# Patient Record
Sex: Female | Born: 1985 | Race: White | Hispanic: No | Marital: Single | Smoking: Former smoker
Health system: Southern US, Community
[De-identification: ages and names within clinical notes are randomized; demographics above are authoritative.]

## PROBLEM LIST (undated history)

## (undated) DIAGNOSIS — R51 Headache: Secondary | ICD-10-CM

## (undated) DIAGNOSIS — IMO0002 Reserved for concepts with insufficient information to code with codable children: Secondary | ICD-10-CM

## (undated) DIAGNOSIS — E162 Hypoglycemia, unspecified: Secondary | ICD-10-CM

## (undated) DIAGNOSIS — S139XXA Sprain of joints and ligaments of unspecified parts of neck, initial encounter: Secondary | ICD-10-CM

## (undated) DIAGNOSIS — C539 Malignant neoplasm of cervix uteri, unspecified: Secondary | ICD-10-CM

## (undated) DIAGNOSIS — J45909 Unspecified asthma, uncomplicated: Secondary | ICD-10-CM

## (undated) DIAGNOSIS — T4145XA Adverse effect of unspecified anesthetic, initial encounter: Secondary | ICD-10-CM

## (undated) DIAGNOSIS — G43909 Migraine, unspecified, not intractable, without status migrainosus: Secondary | ICD-10-CM

## (undated) DIAGNOSIS — K589 Irritable bowel syndrome without diarrhea: Secondary | ICD-10-CM

## (undated) DIAGNOSIS — F419 Anxiety disorder, unspecified: Secondary | ICD-10-CM

## (undated) DIAGNOSIS — E669 Obesity, unspecified: Secondary | ICD-10-CM

## (undated) DIAGNOSIS — T8859XA Other complications of anesthesia, initial encounter: Secondary | ICD-10-CM

## (undated) DIAGNOSIS — Z8489 Family history of other specified conditions: Secondary | ICD-10-CM

## (undated) DIAGNOSIS — I499 Cardiac arrhythmia, unspecified: Secondary | ICD-10-CM

## (undated) HISTORY — DX: Anxiety disorder, unspecified: F41.9

## (undated) HISTORY — DX: Malignant neoplasm of cervix uteri, unspecified: C53.9

## (undated) HISTORY — DX: Reserved for concepts with insufficient information to code with codable children: IMO0002

## (undated) HISTORY — DX: Migraine, unspecified, not intractable, without status migrainosus: G43.909

## (undated) HISTORY — PX: WISDOM TOOTH EXTRACTION: SHX21

## (undated) HISTORY — PX: UPPER GI ENDOSCOPY: SHX6162

## (undated) HISTORY — DX: Headache: R51

## (undated) HISTORY — DX: Hypoglycemia, unspecified: E16.2

## (undated) HISTORY — DX: Unspecified asthma, uncomplicated: J45.909

## (undated) HISTORY — PX: COLONOSCOPY: SHX174

## (undated) HISTORY — DX: Sprain of joints and ligaments of unspecified parts of neck, initial encounter: S13.9XXA

## (undated) HISTORY — DX: Obesity, unspecified: E66.9

---
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
## 2005-03-26 HISTORY — PX: KNEE SURGERY: SHX244

## 2008-03-26 HISTORY — PX: NASAL SINUS SURGERY: SHX719

## 2009-07-01 ENCOUNTER — Ambulatory Visit: Payer: Self-pay | Admitting: Family Medicine

## 2009-07-01 DIAGNOSIS — J0100 Acute maxillary sinusitis, unspecified: Secondary | ICD-10-CM | POA: Insufficient documentation

## 2009-07-01 DIAGNOSIS — R300 Dysuria: Secondary | ICD-10-CM | POA: Insufficient documentation

## 2009-07-01 DIAGNOSIS — M26609 Unspecified temporomandibular joint disorder, unspecified side: Secondary | ICD-10-CM | POA: Insufficient documentation

## 2009-07-01 DIAGNOSIS — J209 Acute bronchitis, unspecified: Secondary | ICD-10-CM | POA: Insufficient documentation

## 2009-07-01 LAB — CONVERTED CEMR LAB
Basophils Absolute: 0 10*3/uL (ref 0.0–0.1)
Eosinophils Absolute: 0 10*3/uL (ref 0.0–0.7)
Eosinophils Relative: 0 % (ref 0–5)
HCT: 41.2 % (ref 36.0–46.0)
Hemoglobin: 14.1 g/dL (ref 12.0–15.0)
MCHC: 34.3 g/dL (ref 30.0–36.0)
Monocytes Absolute: 0.3 10*3/uL (ref 0.1–1.0)
Neutro Abs: 7.5 10*3/uL (ref 1.7–7.7)
RDW: 12.1 % (ref 11.5–15.5)
Urobilinogen, UA: 1
WBC: 11.6 10*3/uL — ABNORMAL HIGH (ref 4.0–10.5)
pH: 7

## 2009-07-02 ENCOUNTER — Encounter: Payer: Self-pay | Admitting: Family Medicine

## 2009-08-15 ENCOUNTER — Ambulatory Visit: Payer: Self-pay | Admitting: Occupational Medicine

## 2009-08-15 LAB — CONVERTED CEMR LAB: Rapid Strep: NEGATIVE

## 2009-08-16 ENCOUNTER — Encounter: Payer: Self-pay | Admitting: Occupational Medicine

## 2010-04-25 NOTE — Assessment & Plan Note (Signed)
Summary: WHEEZING/POSSIBLE UTI/VAGINAL BLEEDING   Vital Signs:  Patient Profile:   25 Years Old Female CC:      painful urination, hematuria, lower back pain, cramping X last night, productive cough X 4 weeks LMP:     06/22/2009 Height:     63.5 inches Weight:      185 pounds O2 Sat:      98 % O2 treatment:    Room Air Temp:     97.4 degrees F oral Pulse rate:   81 / minute Resp:     16 per minute BP sitting:   140 / 87  (right arm) Cuff size:   regular  Pt. in pain?   yes    Location:   back    Intensity:   5    Type:       aching  Vitals Entered By: Lajean Saver RN (July 01, 2009 12:30 PM)  Menstrual History: LMP (date): 06/22/2009                   Updated Prior Medication List: KLONOPIN 0.5 MG TABS (CLONAZEPAM) once daily IBUPROFEN 800 MG TABS (IBUPROFEN) prn  Current Allergies: ! AUGMENTIN (AMOXICILLIN-POT CLAVULANATE)History of Present Illness Chief Complaint: painful urination, hematuria, lower back pain, cramping X last night, productive cough X 4 weeks History of Present Illness: Subjective:  Patient presents with several problems: 1)  About a month ago she developed a cold, and now has persistent cough with wheezing/SOB,  and facial pain.  No recent fevers, chills, and sweats.  No pleuritic pain.  She has a history of asthma and recently has been using her daughter's Ventolin inhaler with decrease in wheezing.   She states that she was evaluated for back ache and possible kidney infection about a month ago and her WBC was mildly elevated at that time.  She has had pneumonia in the past. 2)  She complains of bilateral earache.  She notes that she grinds her teeth at night, and has worn retainers in the past 3)  She complains of mild dysuria/freauency/nocturia, and has noted blood in her urine.  Her last menstrual period ended 5 days ago, then she developed some recurrent vaginal bleeding, now resolved.  No vaginal discharge. No pelvic pain.  REVIEW OF SYSTEMS Constitutional Symptoms      Denies fever, chills, night sweats, weight loss, weight gain, and fatigue.  Eyes       Denies change in vision, eye pain, eye discharge, glasses, contact lenses, and eye surgery. Ear/Nose/Throat/Mouth       Complains of ear pain, dizziness, and sinus problems.      Denies hearing loss/aids, change in hearing, ear discharge, frequent runny nose, frequent nose bleeds, sore throat, hoarseness, and tooth pain or bleeding.  Respiratory       Complains of productive cough, wheezing, and shortness of breath.      Denies dry cough, asthma, bronchitis, and emphysema/COPD.  Cardiovascular       Denies murmurs, chest pain, and tires easily with exhertion.    Gastrointestinal       Complains of stomach pain and nausea/vomiting.      Denies diarrhea, constipation, blood in bowel movements,  and indigestion. Genitourniary       Complains of painful urination and blood or discharge from vagina.      Denies kidney stones and loss of urinary control. Neurological       Complains of headaches.      Denies paralysis, seizures, and fainting/blackouts. Musculoskeletal       Complains of muscle pain.      Denies joint pain, joint stiffness, decreased range of motion, redness, swelling, muscle weakness, and gout.      Comments: back and neck Skin       Denies bruising, unusual mles/lumps or sores, and hair/skin or nail changes.  Psych       Denies mood changes, temper/anger issues, anxiety/stress, speech problems, depression, and sleep problems. Other Comments: urinary symptoms X last night, URI symptoms X 4 weeks   Past History:  Past Medical History: hypoglycemia cervical CA (treated) Deg. disc disease C 5 and 6  and L5 S1  Past Surgical History: ACL replacement Sinus surgery 08/2008 left Eye surgery  Family History: HTN- father Family History of Skin cancer- father Family History Seizures and MS- mother  Social History: Current Smoker 1PPD Alcohol use-yes 2/week Drug use-no Occupation: umemployed Single Smoking Status:  current Drug Use:  no   Objective:  No acute distress  Eyes:  Pupils are equal, round, and reactive to light and accomdation.  Extraocular movement is intact.  Conjunctivae are not inflamed.  Ears:  Canals normal.  Tympanic membranes normal.  There is distinct tenderness over both temporomandibular joints Nose:  Normal septum.  Normal turbinates, mildly congested.   Bilateral maxillary sinus tenderness present.  Pharynx:  Normal  Neck:  Supple.  Shotty tender anterior nodes Lungs:   Bibasilar wheezing, worse on left.   Breath sounds are equal. Abdomen:  Mild suprapubic tenderness without masses or hepatosplenomegaly.  Bowel sounds are present.  Mild bilateral flank tenderness. urinalysis (dipstick):  trace leuks, micro negative Tympanograms:   normal bilaterally   CBC:  WBC 11.6 Assessment New Problems: ACUTE MAXILLARY SINUSITIS (ICD-461.0) TMJ SYNDROME (ICD-524.60) DYSURIA (ICD-788.1) BRONCHITIS, ACUTE WITH MILD BRONCHOSPASM (ICD-466.0) FAMILY HISTORY SEIZURES (ICD-V17.2)   Plan New Medications/Changes: TERAZOL 7 0.4 % CREA (TERCONAZOLE) 1 applicatorful PV at bedtime for one week  #1 tube x 0, 07/01/2009, Donna Christen MD CYCLOBENZAPRINE HCL 10 MG TABS (CYCLOBENZAPRINE HCL) One tab by mouth at bedtime for TMJ pain.  #20 x 0, 07/01/2009, Donna Christen MD CEPHALEXIN 500 MG TABS (CEPHALEXIN) One by mouth three times daily (every 8 hours)  #21 x 0, 07/01/2009, Donna Christen MD  PREDNISONE 10 MG TABS (PREDNISONE) 2 PO BID for 3 days, then 1 BID for 2 days, then 1 daily for 2 days.  Take PC  #18 x 0, 07/01/2009, Donna Christen MD CEFDINIR 300 MG CAPS (CEFDINIR) 1 by mouth q12hr  #20 x 0, 07/01/2009, Donna Christen MD  New Orders: T-CBC w/Diff [[id number]] Urinalysis [81003-65000] T-Chest x-ray, 2 views [71020] T-Culture, Urine [[id number]] New Patient Level IV [99204] Planning Comments:   Urine culture pending. Begin Keflex, expectorant/decongestant, tapering course of prednisone;  may continue albuterol inhaler Begin Ibuprofen 200mg , 4 tabs every 8 hours with food.  Apply ice pack to TMJ several times daily.  Given [employer] about TMJ.  Wear teeth guards at night.  Rx for Flexeril at bedtime. Rx for Terazol vaginal cream if candida vaginitis develops                     Follow-up with PCP if not improving 7 to 10 days.      The patient and/or caregiver has been counseled thoroughly with regard to medications prescribed including dosage, schedule, interactions, rationale for use, and possible side effects and they verbalize understanding.  Diagnoses and expected course of recovery discussed and will return if not improved as expected or if the condition worsens. Patient and/or caregiver verbalized understanding.    Prescriptions: TERAZOL 7 0.4 % CREA (TERCONAZOLE) 1 applicatorful PV at bedtime for one week  #1 tube x 0   Entered and Authorized by:   Donna Christen MD   Signed by:   Donna Christen MD on 07/01/2009   Method used:   Print then Give to Patient   RxID:   [id number] CYCLOBENZAPRINE HCL 10 MG TABS (CYCLOBENZAPRINE HCL) One tab by mouth at bedtime for TMJ pain.  #20 x 0   Entered and Authorized by:   Donna Christen MD   Signed by:   Donna Christen MD on 07/01/2009   Method used:   Print then Give to Patient   RxID:   [id number] CEPHALEXIN 500 MG TABS (CEPHALEXIN) One by mouth three times daily (every 8 hours)  #21 x 0   Entered and Authorized by:   Donna Christen MD   Signed by:   Donna Christen MD on 07/01/2009   Method used:   Print then Give to Patient   RxID:   [id number] PREDNISONE 10 MG TABS (PREDNISONE) 2 PO BID for 3 days, then 1 BID for 2 days, then 1 daily for 2 days.  Take PC  #18 x 0   Entered and Authorized by:   Donna Christen MD   Signed by:   Donna Christen MD on 07/01/2009   Method used:   Print then Give to Patient   RxID:   [id number] CEFDINIR 300 MG CAPS (CEFDINIR) 1 by mouth q12hr  #20 x 0   Entered and Authorized by:   Donna Christen MD   Signed by:   Donna Christen MD on 07/01/2009   Method used:   Print then Give to Patient   RxID:   [id number]   Patient Instructions: 1)  May use Mucinex D (guaifenesin with decongestant) twice daily for congestion. 2)  Increase fluid intake  3)  May take Ibuprofen 200mg , 4 tabs every 8 hours with food for TMJ pain. 4)  May use Ventolin inhaler 2 to 4 times daily. 5)  May use Afrin nasal spray (or generic oxymetazoline) twice daily for about 5 days.  Also recommend using saline nasal spray several  times daily and/or saline nasal irrigation. 6)  Followup with family doctor if not improving 7 to 10 days.  Laboratory Results   Urine Tests  Date/Time Received: July 01, 2009 2:02 PM  Date/Time Reported:  July 01, 2009 2:02 PM   Routine Urinalysis   Color: orange Appearance: Cloudy Glucose: negative   (Normal Range: Negative) Bilirubin: small   (Normal Range: Negative) Ketone: trace (5)   (Normal Range: Negative) Spec. Gravity: 1.015   (Normal Range: 1.003-1.035) Blood: negative   (Normal Range: Negative) pH: 7.0   (Normal Range: 5.0-8.0) Protein: 30   (Normal Range: Negative) Urobilinogen: 1.0   (Normal Range: 0-1) Nitrite: negative   (Normal Range: Negative) Leukocyte Esterace: trace   (Normal Range: Negative)

## 2010-04-25 NOTE — Assessment & Plan Note (Signed)
Summary: Sinus pressure, sorethroatt, cough x 1 wk rm 2   Vital Signs:  Patient Profile:   25 Years Old Female CC:      Cold & URI symptoms Height:     63.5 inches Weight:      186 pounds O2 Sat:      100 % O2 treatment:    Room Air Temp:     97.5 degrees F oral Pulse rate:   93 / minute Pulse rhythm:   regular Resp:     16 per minute BP sitting:   129 / 87  (right arm) Cuff size:   regular  Vitals Entered By: Areta Haber CMA (Aug 15, 2009 8:55 AM)                  Current Allergies: ! AUGMENTIN (AMOXICILLIN-POT CLAVULANATE) ! * MEDICAL TAPE    History of Present Illness Chief Complaint: Cold & URI symptoms History of Present Illness: Presents with a 3 day history of sore throat,  swollen tonsils, cough, and sinus congestion. No fever or chills.   She complains of bilateral ear pain and right ear drainage.   She was in here 6 weeks ago with another URI.  Symptoms from that episode resolved with keflex and prednisone.      Current Problems: OTITIS EXTERNA, UNSPEC. (ICD-380.10) ACUTE TONSILLITIS (ICD-463) ACUTE MAXILLARY SINUSITIS (ICD-461.0) TMJ SYNDROME (ICD-524.60) DYSURIA (ICD-788.1) BRONCHITIS, ACUTE WITH MILD BRONCHOSPASM (ICD-466.0) FAMILY HISTORY SEIZURES (ICD-V17.2)   Current Meds KLONOPIN 0.5 MG TABS (CLONAZEPAM) once daily IBUPROFEN 800 MG TABS (IBUPROFEN) prn FLEXERIL 10 MG TABS (CYCLOBENZAPRINE HCL) 1 tab by mouth bid CIPRODEX 0.3-0.1 % SUSP (CIPROFLOXACIN-DEXAMETHASONE) 2 drops in affected ear three times a day for 7 days AZITHROMYCIN 250 MG  TABS (AZITHROMYCIN) 2 by  mouth today and then 1 daily for 4 days PREDNISONE 10 MG TABS (PREDNISONE) 2 PO BID for 2 days, then 1 BID for 2 days, then 1 daily for 2 days.  Take PC  REVIEW OF SYSTEMS Constitutional Symptoms      Denies fever, chills, night sweats, weight loss, weight gain, and fatigue.  Eyes       Denies change in vision, eye pain, eye discharge, glasses, contact lenses, and eye  surgery. Ear/Nose/Throat/Mouth       Complains of sinus problems and sore throat.      Denies hearing loss/aids, change in hearing, ear pain, ear discharge, dizziness, frequent runny nose, frequent nose bleeds, hoarseness, and tooth pain or bleeding.      Comments: x 1 wk Respiratory       Complains of productive cough.      Denies dry cough, wheezing, shortness of breath, asthma, bronchitis, and emphysema/COPD.  Cardiovascular       Denies murmurs, chest pain, and tires easily with exhertion.    Gastrointestinal       Denies stomach pain, nausea/vomiting, diarrhea, constipation, blood in bowel movements, and indigestion. Genitourniary       Denies painful urination, kidney stones, and loss of urinary control. Neurological       Denies paralysis, seizures, and fainting/blackouts. Musculoskeletal       Denies muscle pain, joint pain, joint stiffness, decreased range of motion, redness, swelling, muscle weakness, and gout.  Skin       Denies bruising, unusual mles/lumps or sores, and hair/skin or nail changes.  Psych       Denies mood changes, temper/anger issues, anxiety/stress, speech problems, depression, and sleep problems. Other Comments: Pt has not seen  PCP for this.   Past History:  Past Medical History: Last updated: 07/01/2009 hypoglycemia cervical CA (treated) Deg. disc disease C 5 and 6  and L5 S1  Past Surgical History: Last updated: 07/01/2009 ACL replacement Sinus surgery 08/2008 left Eye surgery  Family History: Last updated: 07/01/2009 HTN- father Family History of Skin cancer- father Family History Seizures and MS- mother  Social History: Last updated: 07/01/2009 Current Smoker 1PPD Alcohol use-yes 2/week Drug use-no Occupation: umemployed Single  Risk Factors: Smoking Status: current (07/01/2009) Physical Exam General appearance: well developed, well nourished, no acute distress Pupils: equal, round, reactive to light Ears: Normal TM's  bilaterally.  Inflamed right ear canal.  Yellow drainage noted in the canal.  Nasal: swollen red turbinates with congestion Oral/Pharynx: pharyngeal erythema with exudate, uvula midline without deviation.  Swollen tonsils Chest/Lungs: no rales, wheezes, or rhonchi bilateral, breath sounds equal without effort Assessment New Problems: OTITIS EXTERNA, UNSPEC. (ICD-380.10) ACUTE TONSILLITIS (ICD-463)   Plan New Medications/Changes: PREDNISONE 10 MG TABS (PREDNISONE) 2 PO BID for 2 days, then 1 BID for 2 days, then 1 daily for 2 days.  Take PC  #14 x 0, 08/15/2009, Kathrine Haddock MD AZITHROMYCIN 250 MG  TABS (AZITHROMYCIN) 2 by  mouth today and then 1 daily for 4 days  #6 x 0, 08/15/2009, Kathrine Haddock MD CIPRODEX 0.3-0.1 % SUSP (CIPROFLOXACIN-DEXAMETHASONE) 2 drops in affected ear three times a day for 7 days  #1 x 0, 08/15/2009, Kathrine Haddock MD  New Orders: Est. Patient Level II [phone] Planning Comments:   Rapid strep is negative Recommend ciprodex for otitis externa z-pack Follow up with PCP if no better in 5-7 days   The patient and/or caregiver has been counseled thoroughly with regard to medications prescribed including dosage, schedule, interactions, rationale for use, and possible side effects and they verbalize understanding.  Diagnoses and expected course of recovery discussed and will return if not improved as expected or if the condition worsens. Patient and/or caregiver verbalized understanding.  Prescriptions: PREDNISONE 10 MG TABS (PREDNISONE) 2 PO BID for 2 days, then 1 BID for 2 days, then 1 daily for 2 days.  Take PC  #14 x 0   Entered and Authorized by:   Kathrine Haddock MD   Signed by:   Kathrine Haddock MD on 08/15/2009   Method used:   Print then Give to Patient   RxID:   [id number] AZITHROMYCIN 250 MG  TABS (AZITHROMYCIN) 2 by  mouth today and then 1 daily for 4 days  #6 x 0   Entered and Authorized by:   Kathrine Haddock MD   Signed by:   Kathrine Haddock MD  on 08/15/2009   Method used:   Print then Give to Patient   RxID:   [id number] CIPRODEX 0.3-0.1 % SUSP (CIPROFLOXACIN-DEXAMETHASONE) 2 drops in affected ear three times a day for 7 days  #1 x 0   Entered and Authorized by:   Kathrine Haddock MD   Signed by:   Kathrine Haddock MD on 08/15/2009   Method used:   Print then Give to Patient   RxID:   [id number]   Laboratory Results  Date/Time Received: Aug 15, 2009 9:25 AM  Date/Time Reported: Aug 15, 2009 9:25 AM   Other Tests  Rapid Strep: negative  Kit Test Internal QC: Negative   (Normal Range: Negative)   Appended Document: Sinus pressure, sorethroatt, cough x 1 wk rm 2 Since rapid strep was negative,  I will go ahead and order throat culture.

## 2010-04-25 NOTE — Therapy (Signed)
Summary: Audiology  Audiology   Imported By: Joanne Chars CMA 07/02/2009 13:35:00  _____________________________________________________________________  External Attachment:    Type:   Image     Comment:   External Document

## 2011-07-10 ENCOUNTER — Ambulatory Visit: Payer: Medicaid Other | Attending: Physical Medicine and Rehabilitation | Admitting: Physical Therapy

## 2011-07-10 DIAGNOSIS — IMO0001 Reserved for inherently not codable concepts without codable children: Secondary | ICD-10-CM | POA: Insufficient documentation

## 2011-07-10 DIAGNOSIS — M6281 Muscle weakness (generalized): Secondary | ICD-10-CM | POA: Insufficient documentation

## 2011-07-10 DIAGNOSIS — M2550 Pain in unspecified joint: Secondary | ICD-10-CM | POA: Insufficient documentation

## 2011-07-17 ENCOUNTER — Ambulatory Visit: Payer: Medicaid Other | Admitting: Physical Therapy

## 2011-07-31 ENCOUNTER — Ambulatory Visit: Payer: Medicaid Other | Attending: Physical Medicine and Rehabilitation | Admitting: Physical Therapy

## 2011-07-31 DIAGNOSIS — M2550 Pain in unspecified joint: Secondary | ICD-10-CM | POA: Insufficient documentation

## 2011-07-31 DIAGNOSIS — IMO0001 Reserved for inherently not codable concepts without codable children: Secondary | ICD-10-CM | POA: Insufficient documentation

## 2011-07-31 DIAGNOSIS — M6281 Muscle weakness (generalized): Secondary | ICD-10-CM | POA: Insufficient documentation

## 2011-08-06 ENCOUNTER — Ambulatory Visit: Payer: Medicaid Other | Admitting: Physical Therapy

## 2011-09-18 ENCOUNTER — Other Ambulatory Visit (HOSPITAL_BASED_OUTPATIENT_CLINIC_OR_DEPARTMENT_OTHER): Payer: Self-pay | Admitting: Physical Medicine and Rehabilitation

## 2011-09-18 DIAGNOSIS — M545 Low back pain, unspecified: Secondary | ICD-10-CM

## 2011-09-22 ENCOUNTER — Ambulatory Visit (HOSPITAL_BASED_OUTPATIENT_CLINIC_OR_DEPARTMENT_OTHER)
Admission: RE | Admit: 2011-09-22 | Discharge: 2011-09-22 | Disposition: A | Discharge status: Home / Self Care | Payer: Medicaid Other | Source: Ambulatory Visit | Attending: Physical Medicine and Rehabilitation | Admitting: Physical Medicine and Rehabilitation

## 2011-09-22 DIAGNOSIS — M545 Low back pain, unspecified: Secondary | ICD-10-CM

## 2011-09-22 DIAGNOSIS — M5137 Other intervertebral disc degeneration, lumbosacral region: Secondary | ICD-10-CM | POA: Insufficient documentation

## 2011-09-22 DIAGNOSIS — M51379 Other intervertebral disc degeneration, lumbosacral region without mention of lumbar back pain or lower extremity pain: Secondary | ICD-10-CM | POA: Insufficient documentation

## 2011-09-22 DIAGNOSIS — M5126 Other intervertebral disc displacement, lumbar region: Secondary | ICD-10-CM | POA: Insufficient documentation

## 2012-02-04 ENCOUNTER — Other Ambulatory Visit (HOSPITAL_BASED_OUTPATIENT_CLINIC_OR_DEPARTMENT_OTHER): Payer: Self-pay | Admitting: Neurosurgery

## 2012-02-04 DIAGNOSIS — M4802 Spinal stenosis, cervical region: Secondary | ICD-10-CM

## 2012-02-05 ENCOUNTER — Ambulatory Visit (HOSPITAL_BASED_OUTPATIENT_CLINIC_OR_DEPARTMENT_OTHER)
Admission: RE | Admit: 2012-02-05 | Discharge: 2012-02-05 | Disposition: A | Discharge status: Home / Self Care | Payer: Medicaid Other | Source: Ambulatory Visit | Attending: Neurosurgery | Admitting: Neurosurgery

## 2012-02-05 ENCOUNTER — Other Ambulatory Visit (HOSPITAL_BASED_OUTPATIENT_CLINIC_OR_DEPARTMENT_OTHER): Payer: Medicaid Other

## 2012-02-05 DIAGNOSIS — M4802 Spinal stenosis, cervical region: Secondary | ICD-10-CM | POA: Insufficient documentation

## 2012-10-28 ENCOUNTER — Encounter: Payer: Self-pay | Admitting: Neurology

## 2012-10-28 ENCOUNTER — Ambulatory Visit (INDEPENDENT_AMBULATORY_CARE_PROVIDER_SITE_OTHER): Payer: Medicaid Other | Admitting: Neurology

## 2012-10-28 VITALS — BP 115/73 | HR 73 | Ht 63.75 in | Wt 210.0 lb

## 2012-10-28 DIAGNOSIS — S139XXA Sprain of joints and ligaments of unspecified parts of neck, initial encounter: Secondary | ICD-10-CM

## 2012-10-28 DIAGNOSIS — R51 Headache: Secondary | ICD-10-CM

## 2012-10-28 DIAGNOSIS — R519 Headache, unspecified: Secondary | ICD-10-CM | POA: Insufficient documentation

## 2012-10-28 HISTORY — DX: Sprain of joints and ligaments of unspecified parts of neck, initial encounter: S13.9XXA

## 2012-10-28 HISTORY — DX: Headache: R51

## 2012-10-28 MED ORDER — NORTRIPTYLINE HCL 10 MG PO CAPS: ORAL_CAPSULE | ORAL | Status: DC

## 2012-10-28 NOTE — Progress Notes (Signed)
Reason for visit: Headache  Shannon Hines is a 27 y.o. female  History of present illness:  Shannon Hines is a 27 year old right-handed white female with a history of headaches and neck discomfort dating back at least one year. The patient has had headaches on average 20 days out of a month, with only 10 headache free days on average a month. The patient indicates that the headaches are on the top of the head associated with a pressure sensation, occasionally with a shooting pain. The patient also reports some neck discomfort that is chronic in nature, associated with some spasm into the shoulders. The patient indicates that the headaches vary with the neck pain. The patient also has tingling sensations into the hands that also parallels the neck discomfort. The patient reports neck stiffness. The patient has undergone MRI evaluation of the cervical spine that shows minimal disc bulging at the C3-4, C4-5, and C5-6 levels without spinal cord compression. The patient denies any weakness of the extremities or problems controlling the bowels or the bladder. Occasionally, she may have true vertigo associated with the headaches. The patient will occasionally have nausea and vomiting, and she does have a history of true migraine in the past. The patient may have blurred vision with the headache. The patient is sent to this office for further evaluation of the headaches. The patient was placed on Topamax, but she has not taken this medication regularly secondary to the effects on her sense of taste. The patient has Flexeril that she takes on occasion. The patient has not undergone neuromuscular therapy.  Past Medical History  Diagnosis Date  . Migraine   . Anxiety   . Cervical cancer   . Asthma   . Degenerative disc disease   . Hypoglycemia   . Sprain of neck 10/28/2012  . Headache(784.0) 10/28/2012  . Obesity     Past Surgical History  Procedure Laterality Date  . Eye surgery Bilateral 1987   Strabismus surgery  . Nasal sinus surgery  2010  . Knee surgery  2007    ACL    Family History  Problem Relation Age of Onset  . Multiple sclerosis Mother   . Parkinsonism Father   . Hypertension Father   . Lung cancer Maternal Grandmother   . Cancer Paternal Grandfather   . Marfan syndrome Maternal Grandfather     Social history:  reports that she quit smoking about 2 years ago. She does not have any smokeless tobacco history on file. She reports that  drinks alcohol. She reports that she does not use illicit drugs.  Medications:  No current outpatient prescriptions on file prior to visit.   No current facility-administered medications on file prior to visit.    Allergies:  Allergies  Allergen Reactions  . Amoxicillin-Pot Clavulanate     REACTION: rectal bleeding , vomiting  . Adhesive (Tape)   . Cefdinir     ROS:  Out of a complete 14 system review of symptoms, the patient complains only of the following symptoms, and all other reviewed systems are negative.  Fatigue Palpitations of the heart Dizziness Blurred vision Snoring Memory loss, confusion, headache, numbness, weakness, tremor Anxiety, insomnia  Blood pressure 115/73, pulse 73, height 5' 3.75" (1.619 m), weight 210 lb (95.255 kg).  Physical Exam  General: The patient is alert and cooperative at the time of the examination. The patient is moderately obese.  Head: Pupils are equal, round, and reactive to light. Discs are flat bilaterally.  Neck: The neck  is supple, no carotid bruits are noted.  Respiratory: The respiratory examination is clear.  Cardiovascular: The cardiovascular examination reveals a regular rate and rhythm, no obvious murmurs or rubs are noted.  Neuromuscular: The patient lacks about 10-15 of lateral rotation of the cervical spine bilaterally.  Skin: Extremities are without significant edema.  Neurologic Exam  Mental status:  Cranial nerves: Facial symmetry is present.  There is good sensation of the face to pinprick and soft touch bilaterally. The strength of the facial muscles and the muscles to head turning and shoulder shrug are normal bilaterally. Speech is well enunciated, no aphasia or dysarthria is noted. Extraocular movements are full. Visual fields are full.  Motor: The motor testing reveals 5 over 5 strength of all 4 extremities. Good symmetric motor tone is noted throughout.  Sensory: Sensory testing is intact to pinprick, soft touch, vibration sensation, and position sense on the extremities, but the patient notes that the left arm and left leg are slightly less sensitive to pinprick sensation than the right side. No evidence of extinction is noted.  Coordination: Cerebellar testing reveals good finger-nose-finger and heel-to-shin bilaterally.  Gait and station: Gait is normal. Tandem gait is normal. Romberg is negative. No drift is seen.  Reflexes: Deep tendon reflexes are symmetric and normal bilaterally. Toes are downgoing bilaterally.   Assessment/Plan:  1. Cervical strain syndrome  2. Muscle tension headache  3. History of migraine  The patient likely has headaches associated with her cervical strain syndrome. The patient may have some migraine as well. The numbness and tingly sensations into the hands is likely related to the muscle spasm as well. The patient will be placed on nortriptyline, with a gradually increasing dose. The patient will be set up for neuromuscular therapy. The patient is to learn a home regimen for neuromuscular therapy. The patient will undergo MRI evaluation of the brain. The patient will followup through this office in 4 months.  Marlan Palau MD 10/28/2012 7:30 PM  [facility] [address] [place], [place] [address]  Phone [phone] Fax [phone]

## 2012-11-26 ENCOUNTER — Inpatient Hospital Stay: Admission: RE | Admit: 2012-11-26 | Payer: Medicaid Other | Source: Ambulatory Visit

## 2012-12-03 ENCOUNTER — Telehealth: Payer: Self-pay

## 2012-12-03 NOTE — Telephone Encounter (Signed)
I spoke with Shannon Hines and we reviewed this case. Patient has been on medication without relief. Shannon Hines will resubmit request for MRI now.

## 2012-12-03 NOTE — Telephone Encounter (Signed)
I called patient to confirm she had an appointment scheduled for an MRI. She said the imaging place called and scheduled and then called back and cancelled and said there was an issue with her insurance. I let her know that,according to our record, she has been approved by her insurance. I will speak with [facility] and see what is going on and will get back to her. Patient thanked me.

## 2012-12-12 ENCOUNTER — Ambulatory Visit
Admission: RE | Admit: 2012-12-12 | Discharge: 2012-12-12 | Disposition: A | Discharge status: Home / Self Care | Payer: Medicaid Other | Source: Ambulatory Visit | Attending: Neurology | Admitting: Neurology

## 2012-12-12 DIAGNOSIS — S139XXA Sprain of joints and ligaments of unspecified parts of neck, initial encounter: Secondary | ICD-10-CM

## 2012-12-12 DIAGNOSIS — R51 Headache: Secondary | ICD-10-CM

## 2012-12-15 ENCOUNTER — Telehealth: Payer: Self-pay | Admitting: Neurology

## 2012-12-15 MED ORDER — NORTRIPTYLINE HCL 25 MG PO CAPS: 50.0000 mg | ORAL_CAPSULE | Freq: Every day | ORAL | Status: DC

## 2012-12-15 NOTE — Telephone Encounter (Signed)
I called the patient. The MRI the brain is normal. The patient is on nortriptyline at 30 mg at night. This has essentially eliminated all of her headaches. The patient is still having a lot of neck pain, but her insurance will not cover a lot of physical therapy for neuromuscular therapy. The patient will be going on her insurance at work, and getting off of Medicaid. She will contact them when this happens, and we will make another referral for neuromuscular therapy. We will go up on the nortriptyline to 50 mg at night. The patient is to do stretching exercises for the neck on a regular basis.

## 2013-01-14 ENCOUNTER — Telehealth: Payer: Self-pay | Admitting: Neurology

## 2013-01-14 MED ORDER — NORTRIPTYLINE HCL 25 MG PO CAPS: 50.0000 mg | ORAL_CAPSULE | Freq: Every day | ORAL | Status: DC

## 2013-01-14 NOTE — Telephone Encounter (Signed)
The updated Rx was already sent to [facility] on 09/22 per Dr Anne Hahn' note.  I have resent the Rx.

## 2013-02-24 ENCOUNTER — Telehealth: Payer: Self-pay

## 2013-02-24 NOTE — Telephone Encounter (Signed)
Patient has to cancel appointment due to insurance and needed a late afternoon appointment because of her work schedule.  She will need refills before the appointment and will call the office.

## 2013-02-27 ENCOUNTER — Ambulatory Visit: Payer: Medicaid Other | Admitting: Nurse Practitioner

## 2013-04-22 ENCOUNTER — Telehealth: Payer: Self-pay | Admitting: Nurse Practitioner

## 2013-04-23 NOTE — Telephone Encounter (Signed)
Patient last saw Dr. Jannifer Franklin on 10/28/2012 for headaches and neck pain,  Was started on Nortriptiline 30 mg.  Patient has a MRI on 12/12/2012 which was normal and the medication was increased to 50 mg.  Called patient to see if the medication is keeping her symptoms under control? And if so when does she need to followup with the doctor?

## 2013-04-23 NOTE — Telephone Encounter (Signed)
Patient states that her appointment has been pushed out for 3 months - twice and she does see her neurosugeon for her neck problems and the Nortriptyline is controlling her headaches, so does she need to followup with you.  Please advise.

## 2013-04-23 NOTE — Telephone Encounter (Signed)
I called patient. The patient is doing very well on nortriptyline. The patient was then note she can consolidate doctors, and have her primary care physician write the prescription for the nortriptyline. If her doctor is willing to do this, the patient may not need a revisit through this office. The patient could be seen if needed.

## 2013-05-07 ENCOUNTER — Ambulatory Visit: Payer: Self-pay | Admitting: Nurse Practitioner

## 2013-08-03 ENCOUNTER — Ambulatory Visit: Payer: Medicaid Other | Admitting: Nurse Practitioner

## 2013-11-25 ENCOUNTER — Other Ambulatory Visit (HOSPITAL_BASED_OUTPATIENT_CLINIC_OR_DEPARTMENT_OTHER): Payer: Self-pay | Admitting: Neurosurgery

## 2013-11-25 DIAGNOSIS — M5412 Radiculopathy, cervical region: Secondary | ICD-10-CM

## 2013-11-25 DIAGNOSIS — M5126 Other intervertebral disc displacement, lumbar region: Secondary | ICD-10-CM

## 2013-11-28 ENCOUNTER — Ambulatory Visit (HOSPITAL_BASED_OUTPATIENT_CLINIC_OR_DEPARTMENT_OTHER)
Admission: RE | Admit: 2013-11-28 | Discharge: 2013-11-28 | Disposition: A | Discharge status: Home / Self Care | Payer: Medicaid Other | Source: Ambulatory Visit | Attending: Neurosurgery | Admitting: Neurosurgery

## 2013-11-28 DIAGNOSIS — M47812 Spondylosis without myelopathy or radiculopathy, cervical region: Secondary | ICD-10-CM | POA: Insufficient documentation

## 2013-11-28 DIAGNOSIS — M5124 Other intervertebral disc displacement, thoracic region: Secondary | ICD-10-CM | POA: Diagnosis not present

## 2013-11-28 DIAGNOSIS — R209 Unspecified disturbances of skin sensation: Secondary | ICD-10-CM | POA: Diagnosis not present

## 2013-11-28 DIAGNOSIS — M5380 Other specified dorsopathies, site unspecified: Secondary | ICD-10-CM | POA: Insufficient documentation

## 2013-11-28 DIAGNOSIS — M5030 Other cervical disc degeneration, unspecified cervical region: Secondary | ICD-10-CM | POA: Insufficient documentation

## 2013-11-28 DIAGNOSIS — M25559 Pain in unspecified hip: Secondary | ICD-10-CM | POA: Diagnosis present

## 2013-11-28 DIAGNOSIS — M5126 Other intervertebral disc displacement, lumbar region: Secondary | ICD-10-CM

## 2013-11-28 DIAGNOSIS — G8929 Other chronic pain: Secondary | ICD-10-CM | POA: Diagnosis not present

## 2013-11-28 DIAGNOSIS — M542 Cervicalgia: Secondary | ICD-10-CM | POA: Diagnosis not present

## 2013-11-28 DIAGNOSIS — M48061 Spinal stenosis, lumbar region without neurogenic claudication: Secondary | ICD-10-CM | POA: Diagnosis not present

## 2013-11-28 DIAGNOSIS — M5412 Radiculopathy, cervical region: Secondary | ICD-10-CM

## 2013-12-22 ENCOUNTER — Encounter (HOSPITAL_COMMUNITY): Payer: Self-pay | Admitting: Pharmacy Technician

## 2013-12-22 ENCOUNTER — Other Ambulatory Visit: Payer: Self-pay | Admitting: Neurosurgery

## 2013-12-24 ENCOUNTER — Encounter (HOSPITAL_COMMUNITY): Payer: Self-pay

## 2013-12-24 ENCOUNTER — Encounter (HOSPITAL_COMMUNITY)
Admission: RE | Admit: 2013-12-24 | Discharge: 2013-12-24 | Disposition: A | Discharge status: Home / Self Care | Payer: Medicaid Other | Source: Ambulatory Visit | Attending: Neurosurgery | Admitting: Neurosurgery

## 2013-12-24 DIAGNOSIS — Z791 Long term (current) use of non-steroidal anti-inflammatories (NSAID): Secondary | ICD-10-CM | POA: Diagnosis not present

## 2013-12-24 DIAGNOSIS — E669 Obesity, unspecified: Secondary | ICD-10-CM | POA: Diagnosis not present

## 2013-12-24 DIAGNOSIS — Z79899 Other long term (current) drug therapy: Secondary | ICD-10-CM | POA: Diagnosis not present

## 2013-12-24 DIAGNOSIS — M4802 Spinal stenosis, cervical region: Secondary | ICD-10-CM | POA: Diagnosis not present

## 2013-12-24 DIAGNOSIS — Z87891 Personal history of nicotine dependence: Secondary | ICD-10-CM | POA: Diagnosis not present

## 2013-12-24 DIAGNOSIS — M4722 Other spondylosis with radiculopathy, cervical region: Secondary | ICD-10-CM | POA: Diagnosis present

## 2013-12-24 DIAGNOSIS — G43909 Migraine, unspecified, not intractable, without status migrainosus: Secondary | ICD-10-CM | POA: Diagnosis not present

## 2013-12-24 HISTORY — DX: Family history of other specified conditions: Z84.89

## 2013-12-24 HISTORY — DX: Irritable bowel syndrome, unspecified: K58.9

## 2013-12-24 HISTORY — DX: Cardiac arrhythmia, unspecified: I49.9

## 2013-12-24 HISTORY — DX: Adverse effect of unspecified anesthetic, initial encounter: T41.45XA

## 2013-12-24 HISTORY — DX: Other complications of anesthesia, initial encounter: T88.59XA

## 2013-12-24 LAB — CBC
HCT: 41.7 % (ref 36.0–46.0)
HEMOGLOBIN: 14 g/dL (ref 12.0–15.0)
MCH: 30.2 pg (ref 26.0–34.0)
MCHC: 33.6 g/dL (ref 30.0–36.0)
MCV: 89.9 fL (ref 78.0–100.0)
PLATELETS: 276 10*3/uL (ref 150–400)
RBC: 4.64 MIL/uL (ref 3.87–5.11)
RDW: 12.4 % (ref 11.5–15.5)
WBC: 8.5 10*3/uL (ref 4.0–10.5)

## 2013-12-24 LAB — BASIC METABOLIC PANEL
Anion gap: 12 (ref 5–15)
BUN: 13 mg/dL (ref 6–23)
CHLORIDE: 101 meq/L (ref 96–112)
CO2: 26 mEq/L (ref 19–32)
Calcium: 9.2 mg/dL (ref 8.4–10.5)
Creatinine, Ser: 0.74 mg/dL (ref 0.50–1.10)
GFR calc Af Amer: >90 mL/min (ref >90)
GLUCOSE: 66 mg/dL — AB (ref 70–99)
POTASSIUM: 3.9 meq/L (ref 3.7–5.3)
SODIUM: 139 meq/L (ref 137–147)

## 2013-12-24 LAB — SURGICAL PCR SCREEN
MRSA, PCR: NEGATIVE
STAPHYLOCOCCUS AUREUS: NEGATIVE

## 2013-12-24 LAB — HCG, SERUM, QUALITATIVE: PREG SERUM: NEGATIVE

## 2013-12-24 NOTE — Pre-Procedure Instructions (Signed)
Miryam Mcelhinney    Your procedure is scheduled on: Friday, October 2.  Report to [facility] Admitting at 11:00AM.  Call this number if you have problems the morning of surgery: [phone]   Remember:   Do not eat food or drink liquids after midnight.  Take these medicines the morning of surgery with A SIP OF WATER: Take if needed: clonazePAM (KLONOPIN).              Stop taking Meloxicam (Mobic)   Do not wear jewelry, make-up or nail polish.  Do not wear lotions, powders, or perfumes.   Do not shave 48 hours prior to surgery.   Do not bring valuables to the hospital.             [facility] is not responsible for any belongings or valuables.               Contacts, dentures or bridgework may not be worn into surgery.  Leave suitcase in the car. After surgery it may be brought to your room.  For patients admitted to the hospital, discharge time is determined by your treatment team.               Patients discharged the day of surgery will not be allowed to drive home.  Name and phone number of your driver: -   Special Instructions: Review  [facility] - Preparing For Surgery.   Please read over the following fact sheets that you were given: Pain Booklet, Coughing and Deep Breathing and Surgical Site Infection Prevention

## 2013-12-25 ENCOUNTER — Encounter (HOSPITAL_COMMUNITY)
Admission: RE | Disposition: A | Discharge status: Home / Self Care | Payer: Self-pay | Source: Ambulatory Visit | Attending: Neurosurgery

## 2013-12-25 ENCOUNTER — Encounter (HOSPITAL_COMMUNITY): Payer: Self-pay | Admitting: *Deleted

## 2013-12-25 ENCOUNTER — Ambulatory Visit (HOSPITAL_COMMUNITY): Payer: Medicaid Other | Admitting: Anesthesiology

## 2013-12-25 ENCOUNTER — Ambulatory Visit (HOSPITAL_COMMUNITY): Payer: Medicaid Other

## 2013-12-25 ENCOUNTER — Encounter (HOSPITAL_COMMUNITY): Payer: Medicaid Other | Admitting: Anesthesiology

## 2013-12-25 ENCOUNTER — Ambulatory Visit (HOSPITAL_COMMUNITY)
Admission: RE | Admit: 2013-12-25 | Discharge: 2013-12-25 | Disposition: A | Discharge status: Home / Self Care | Payer: Medicaid Other | Source: Ambulatory Visit | Attending: Neurosurgery | Admitting: Neurosurgery

## 2013-12-25 DIAGNOSIS — G43909 Migraine, unspecified, not intractable, without status migrainosus: Secondary | ICD-10-CM | POA: Diagnosis not present

## 2013-12-25 DIAGNOSIS — E669 Obesity, unspecified: Secondary | ICD-10-CM | POA: Diagnosis not present

## 2013-12-25 DIAGNOSIS — M4802 Spinal stenosis, cervical region: Secondary | ICD-10-CM | POA: Insufficient documentation

## 2013-12-25 DIAGNOSIS — Z79899 Other long term (current) drug therapy: Secondary | ICD-10-CM | POA: Insufficient documentation

## 2013-12-25 DIAGNOSIS — Z87891 Personal history of nicotine dependence: Secondary | ICD-10-CM | POA: Insufficient documentation

## 2013-12-25 DIAGNOSIS — Z791 Long term (current) use of non-steroidal anti-inflammatories (NSAID): Secondary | ICD-10-CM | POA: Insufficient documentation

## 2013-12-25 DIAGNOSIS — M4722 Other spondylosis with radiculopathy, cervical region: Secondary | ICD-10-CM | POA: Diagnosis not present

## 2013-12-25 HISTORY — PX: ANTERIOR CERVICAL DECOMP/DISCECTOMY FUSION: SHX1161

## 2013-12-25 LAB — GLUCOSE, CAPILLARY: GLUCOSE-CAPILLARY: 106 mg/dL — AB (ref 70–99)

## 2013-12-25 SURGERY — ANTERIOR CERVICAL DECOMPRESSION/DISCECTOMY FUSION 2 LEVELS
Anesthesia: General

## 2013-12-25 MED ORDER — ONDANSETRON HCL 4 MG/2ML IJ SOLN
INTRAMUSCULAR | Status: DC | PRN
Start: 2013-12-25 — End: 2013-12-25
  Administered 2013-12-25 (×2): 4 mg via INTRAVENOUS

## 2013-12-25 MED ORDER — ROCURONIUM BROMIDE 50 MG/5ML IV SOLN
INTRAVENOUS | Status: AC
  Filled 2013-12-25: qty 1

## 2013-12-25 MED ORDER — DEXAMETHASONE SODIUM PHOSPHATE 4 MG/ML IJ SOLN
INTRAMUSCULAR | Status: AC
  Filled 2013-12-25: qty 2

## 2013-12-25 MED ORDER — LACTATED RINGERS IV SOLN
INTRAVENOUS | Status: DC
  Administered 2013-12-25: 11:00:00 via INTRAVENOUS

## 2013-12-25 MED ORDER — ONDANSETRON HCL 4 MG/2ML IJ SOLN: 4.0000 mg | INTRAMUSCULAR | Status: DC | PRN

## 2013-12-25 MED ORDER — ONDANSETRON HCL 4 MG/2ML IJ SOLN: 4.0000 mg | Freq: Once | INTRAMUSCULAR | Status: DC | PRN

## 2013-12-25 MED ORDER — OXYCODONE HCL 5 MG PO TABS
5.0000 mg | ORAL_TABLET | Freq: Once | ORAL | Status: AC | PRN
  Administered 2013-12-25: 5 mg via ORAL

## 2013-12-25 MED ORDER — LIDOCAINE HCL (CARDIAC) 20 MG/ML IV SOLN
INTRAVENOUS | Status: DC | PRN
  Administered 2013-12-25: 100 mg via INTRAVENOUS
  Administered 2013-12-25: 50 mg via INTRAVENOUS

## 2013-12-25 MED ORDER — HYDROMORPHONE HCL 1 MG/ML IJ SOLN
INTRAMUSCULAR | Status: AC
  Filled 2013-12-25: qty 1

## 2013-12-25 MED ORDER — SODIUM CHLORIDE 0.9 % IJ SOLN
INTRAMUSCULAR | Status: AC
  Filled 2013-12-25: qty 10

## 2013-12-25 MED ORDER — GLYCOPYRROLATE 0.2 MG/ML IJ SOLN
INTRAMUSCULAR | Status: AC
  Filled 2013-12-25: qty 3

## 2013-12-25 MED ORDER — DEXAMETHASONE SODIUM PHOSPHATE 4 MG/ML IJ SOLN
INTRAMUSCULAR | Status: AC
  Filled 2013-12-25: qty 1

## 2013-12-25 MED ORDER — MENTHOL 3 MG MT LOZG
1.0000 | LOZENGE | OROMUCOSAL | Status: DC | PRN
  Filled 2013-12-25: qty 9

## 2013-12-25 MED ORDER — MIDAZOLAM HCL 5 MG/5ML IJ SOLN
INTRAMUSCULAR | Status: DC | PRN
  Administered 2013-12-25: 2 mg via INTRAVENOUS

## 2013-12-25 MED ORDER — CYCLOBENZAPRINE HCL 10 MG PO TABS: 5.0000 mg | ORAL_TABLET | Freq: Two times a day (BID) | ORAL | Status: DC | PRN

## 2013-12-25 MED ORDER — CYCLOBENZAPRINE HCL 10 MG PO TABS
10.0000 mg | ORAL_TABLET | Freq: Three times a day (TID) | ORAL | Status: DC | PRN
  Administered 2013-12-25: 10 mg via ORAL

## 2013-12-25 MED ORDER — NAPROXEN 500 MG PO TABS
500.0000 mg | ORAL_TABLET | Freq: Two times a day (BID) | ORAL | Status: DC
  Administered 2013-12-25: 500 mg via ORAL
  Filled 2013-12-25 (×2): qty 1

## 2013-12-25 MED ORDER — CYCLOBENZAPRINE HCL 10 MG PO TABS
ORAL_TABLET | ORAL | Status: AC
  Filled 2013-12-25: qty 1

## 2013-12-25 MED ORDER — SODIUM CHLORIDE 0.9 % IV SOLN: 250.0000 mL | INTRAVENOUS | Status: DC

## 2013-12-25 MED ORDER — ACETAMINOPHEN 325 MG PO TABS: 650.0000 mg | ORAL_TABLET | ORAL | Status: DC | PRN

## 2013-12-25 MED ORDER — SODIUM CHLORIDE 0.9 % IJ SOLN: 3.0000 mL | Freq: Two times a day (BID) | INTRAMUSCULAR | Status: DC

## 2013-12-25 MED ORDER — PROPOFOL 10 MG/ML IV BOLUS
INTRAVENOUS | Status: DC | PRN
  Administered 2013-12-25: 110 mg via INTRAVENOUS

## 2013-12-25 MED ORDER — ARTIFICIAL TEARS OP OINT
TOPICAL_OINTMENT | OPHTHALMIC | Status: AC
  Filled 2013-12-25: qty 3.5

## 2013-12-25 MED ORDER — ACETAMINOPHEN 650 MG RE SUPP: 650.0000 mg | RECTAL | Status: DC | PRN

## 2013-12-25 MED ORDER — MIDAZOLAM HCL 2 MG/2ML IJ SOLN
INTRAMUSCULAR | Status: AC
  Filled 2013-12-25: qty 2

## 2013-12-25 MED ORDER — OXYCODONE HCL 5 MG/5ML PO SOLN: 5.0000 mg | Freq: Once | ORAL | Status: AC | PRN

## 2013-12-25 MED ORDER — NORTRIPTYLINE HCL 25 MG PO CAPS
50.0000 mg | ORAL_CAPSULE | Freq: Every evening | ORAL | Status: DC | PRN
  Filled 2013-12-25: qty 2

## 2013-12-25 MED ORDER — GLYCOPYRROLATE 0.2 MG/ML IJ SOLN
INTRAMUSCULAR | Status: DC | PRN
  Administered 2013-12-25: 0.6 mg via INTRAVENOUS

## 2013-12-25 MED ORDER — PHENOL 1.4 % MT LIQD: 1.0000 | OROMUCOSAL | Status: DC | PRN

## 2013-12-25 MED ORDER — FENTANYL CITRATE 0.05 MG/ML IJ SOLN
INTRAMUSCULAR | Status: AC
  Filled 2013-12-25: qty 5

## 2013-12-25 MED ORDER — FENTANYL CITRATE 0.05 MG/ML IJ SOLN
INTRAMUSCULAR | Status: DC | PRN
  Administered 2013-12-25 (×2): 50 ug via INTRAVENOUS
  Administered 2013-12-25 (×4): 100 ug via INTRAVENOUS

## 2013-12-25 MED ORDER — CEFAZOLIN SODIUM-DEXTROSE 2-3 GM-% IV SOLR
INTRAVENOUS | Status: DC | PRN
  Administered 2013-12-25: 2 g via INTRAVENOUS

## 2013-12-25 MED ORDER — ONDANSETRON HCL 4 MG/2ML IJ SOLN
INTRAMUSCULAR | Status: AC
  Filled 2013-12-25: qty 2

## 2013-12-25 MED ORDER — HYDROMORPHONE HCL 1 MG/ML IJ SOLN
0.5000 mg | INTRAMUSCULAR | Status: DC | PRN
Start: 2013-12-25 — End: 2013-12-26

## 2013-12-25 MED ORDER — ARTIFICIAL TEARS OP OINT
TOPICAL_OINTMENT | OPHTHALMIC | Status: DC | PRN
  Administered 2013-12-25: 1 via OPHTHALMIC

## 2013-12-25 MED ORDER — DEXAMETHASONE SODIUM PHOSPHATE 4 MG/ML IJ SOLN
INTRAMUSCULAR | Status: DC | PRN
  Administered 2013-12-25: 10 mg via INTRAVENOUS

## 2013-12-25 MED ORDER — NEOSTIGMINE METHYLSULFATE 10 MG/10ML IV SOLN
INTRAVENOUS | Status: DC | PRN
  Administered 2013-12-25: 5 mg via INTRAVENOUS

## 2013-12-25 MED ORDER — BACITRACIN 50000 UNITS IM SOLR
INTRAMUSCULAR | Status: DC | PRN
  Administered 2013-12-25: 12:00:00

## 2013-12-25 MED ORDER — MEPERIDINE HCL 25 MG/ML IJ SOLN: 6.2500 mg | INTRAMUSCULAR | Status: DC | PRN

## 2013-12-25 MED ORDER — LIDOCAINE HCL 4 % MT SOLN
OROMUCOSAL | Status: DC | PRN
  Administered 2013-12-25: 4 mL via TOPICAL

## 2013-12-25 MED ORDER — THROMBIN 5000 UNITS EX SOLR
OROMUCOSAL | Status: DC | PRN
  Administered 2013-12-25: 13:00:00 via TOPICAL

## 2013-12-25 MED ORDER — ROCURONIUM BROMIDE 100 MG/10ML IV SOLN
INTRAVENOUS | Status: DC | PRN
  Administered 2013-12-25: 30 mg via INTRAVENOUS
  Administered 2013-12-25: 50 mg via INTRAVENOUS

## 2013-12-25 MED ORDER — OXYCODONE-ACETAMINOPHEN 5-325 MG PO TABS
1.0000 | ORAL_TABLET | ORAL | Status: DC | PRN
  Administered 2013-12-25: 2 via ORAL
  Filled 2013-12-25: qty 2

## 2013-12-25 MED ORDER — LIDOCAINE HCL (CARDIAC) 20 MG/ML IV SOLN
INTRAVENOUS | Status: AC
  Filled 2013-12-25: qty 5

## 2013-12-25 MED ORDER — SODIUM CHLORIDE 0.9 % IJ SOLN: 3.0000 mL | INTRAMUSCULAR | Status: DC | PRN

## 2013-12-25 MED ORDER — OXYCODONE HCL 10 MG PO TABS
10.0000 mg | ORAL_TABLET | ORAL | Status: DC | PRN
Start: 2013-12-25 — End: 2021-01-23

## 2013-12-25 MED ORDER — LACTATED RINGERS IV SOLN
INTRAVENOUS | Status: DC | PRN
  Administered 2013-12-25 (×2): via INTRAVENOUS

## 2013-12-25 MED ORDER — OXYCODONE HCL 5 MG PO TABS
ORAL_TABLET | ORAL | Status: AC
  Filled 2013-12-25: qty 1

## 2013-12-25 MED ORDER — CYCLOBENZAPRINE HCL 10 MG PO TABS: 10.0000 mg | ORAL_TABLET | Freq: Three times a day (TID) | ORAL | Status: AC | PRN

## 2013-12-25 MED ORDER — HYDROMORPHONE HCL 1 MG/ML IJ SOLN
0.2500 mg | INTRAMUSCULAR | Status: DC | PRN
  Administered 2013-12-25 (×4): 0.5 mg via INTRAVENOUS

## 2013-12-25 MED ORDER — 0.9 % SODIUM CHLORIDE (POUR BTL) OPTIME
TOPICAL | Status: DC | PRN
  Administered 2013-12-25: 1000 mL

## 2013-12-25 MED ORDER — DEXTROSE 5 % IV SOLN
INTRAVENOUS | Status: DC | PRN
  Administered 2013-12-25: 12:00:00 via INTRAVENOUS

## 2013-12-25 MED ORDER — THROMBIN 5000 UNITS EX SOLR
CUTANEOUS | Status: DC | PRN
  Administered 2013-12-25 (×2): 5000 [IU] via TOPICAL

## 2013-12-25 MED ORDER — NEOSTIGMINE METHYLSULFATE 10 MG/10ML IV SOLN
INTRAVENOUS | Status: AC
  Filled 2013-12-25: qty 2

## 2013-12-25 MED ORDER — HEMOSTATIC AGENTS (NO CHARGE) OPTIME
TOPICAL | Status: DC | PRN
  Administered 2013-12-25: 1 via TOPICAL

## 2013-12-25 MED ORDER — EPHEDRINE SULFATE 50 MG/ML IJ SOLN
INTRAMUSCULAR | Status: AC
  Filled 2013-12-25: qty 1

## 2013-12-25 MED ORDER — STERILE WATER FOR INJECTION IJ SOLN
INTRAMUSCULAR | Status: AC
  Filled 2013-12-25: qty 10

## 2013-12-25 MED ORDER — CLONAZEPAM 0.5 MG PO TABS: 0.5000 mg | ORAL_TABLET | Freq: Three times a day (TID) | ORAL | Status: DC | PRN

## 2013-12-25 MED ORDER — CEFAZOLIN SODIUM 1-5 GM-% IV SOLN
1.0000 g | Freq: Three times a day (TID) | INTRAVENOUS | Status: DC
  Administered 2013-12-25: 1 g via INTRAVENOUS
  Filled 2013-12-25 (×2): qty 50

## 2013-12-25 SURGICAL SUPPLY — 58 items
BAG DECANTER FOR FLEXI CONT (MISCELLANEOUS) ×2 IMPLANT
BENZOIN TINCTURE PRP APPL 2/3 (GAUZE/BANDAGES/DRESSINGS) ×2 IMPLANT
BIT DRILL SM SPINE QC 12 (BIT) ×2 IMPLANT
BONE ALLOSTEM MORSELIZED 5CC (Bone Implant) ×2 IMPLANT
BRUSH SCRUB EZ PLAIN DRY (MISCELLANEOUS) ×2 IMPLANT
BUR MATCHSTICK NEURO 3.0 LAGG (BURR) ×2 IMPLANT
CANISTER SUCT 3000ML (MISCELLANEOUS) ×2 IMPLANT
CONT SPEC 4OZ CLIKSEAL STRL BL (MISCELLANEOUS) ×2 IMPLANT
DERMABOND ADVANCED (GAUZE/BANDAGES/DRESSINGS) ×1
DERMABOND ADVANCED .7 DNX12 (GAUZE/BANDAGES/DRESSINGS) ×1 IMPLANT
DRAPE C-ARM 42X72 X-RAY (DRAPES) ×4 IMPLANT
DRAPE LAPAROTOMY 100X72 PEDS (DRAPES) ×2 IMPLANT
DRAPE MICROSCOPE LEICA (MISCELLANEOUS) ×2 IMPLANT
DRAPE POUCH INSTRU U-SHP 10X18 (DRAPES) ×2 IMPLANT
DRSG OPSITE POSTOP 4X6 (GAUZE/BANDAGES/DRESSINGS) ×2 IMPLANT
DURAPREP 6ML APPLICATOR 50/CS (WOUND CARE) ×2 IMPLANT
ELECT COATED BLADE 2.86 ST (ELECTRODE) ×2 IMPLANT
ELECT REM PT RETURN 9FT ADLT (ELECTROSURGICAL) ×2
ELECTRODE REM PT RTRN 9FT ADLT (ELECTROSURGICAL) ×1 IMPLANT
GAUZE SPONGE 4X4 12PLY STRL (GAUZE/BANDAGES/DRESSINGS) ×2 IMPLANT
GAUZE SPONGE 4X4 16PLY XRAY LF (GAUZE/BANDAGES/DRESSINGS) IMPLANT
GLOVE BIO SURGEON STRL SZ8 (GLOVE) ×2 IMPLANT
GLOVE ECLIPSE 7.5 STRL STRAW (GLOVE) ×8 IMPLANT
GLOVE EXAM NITRILE LRG STRL (GLOVE) ×2 IMPLANT
GLOVE EXAM NITRILE MD LF STRL (GLOVE) IMPLANT
GLOVE EXAM NITRILE XL STR (GLOVE) IMPLANT
GLOVE EXAM NITRILE XS STR PU (GLOVE) IMPLANT
GLOVE INDICATOR 8.5 STRL (GLOVE) ×2 IMPLANT
GLOVE SURG SS PI 8.0 STRL IVOR (GLOVE) ×4 IMPLANT
GOWN STRL REUS W/ TWL LRG LVL3 (GOWN DISPOSABLE) IMPLANT
GOWN STRL REUS W/ TWL XL LVL3 (GOWN DISPOSABLE) ×2 IMPLANT
GOWN STRL REUS W/TWL 2XL LVL3 (GOWN DISPOSABLE) ×2 IMPLANT
GOWN STRL REUS W/TWL LRG LVL3 (GOWN DISPOSABLE)
GOWN STRL REUS W/TWL XL LVL3 (GOWN DISPOSABLE) ×2
HALTER HD/CHIN CERV TRACTION D (MISCELLANEOUS) ×2 IMPLANT
HEMOSTAT POWDER KIT SURGIFOAM (HEMOSTASIS) ×2 IMPLANT
HEMOSTAT POWDER SURGIFOAM 1G (HEMOSTASIS) ×2 IMPLANT
KIT BASIN OR (CUSTOM PROCEDURE TRAY) ×2 IMPLANT
KIT ROOM TURNOVER OR (KITS) ×2 IMPLANT
NEEDLE SPNL 20GX3.5 QUINCKE YW (NEEDLE) ×2 IMPLANT
NS IRRIG 1000ML POUR BTL (IV SOLUTION) ×2 IMPLANT
PACK LAMINECTOMY NEURO (CUSTOM PROCEDURE TRAY) ×2 IMPLANT
PAD ARMBOARD 7.5X6 YLW CONV (MISCELLANEOUS) ×6 IMPLANT
PLATE ANT CERV XTEND 2 LV 32 (Plate) ×2 IMPLANT
RUBBERBAND STERILE (MISCELLANEOUS) ×4 IMPLANT
SCREW XTD VAR 4.2 SELF TAP 12 (Screw) ×12 IMPLANT
SPACER COLONIAL LGE 8MM 7DEG (Spacer) ×4 IMPLANT
SPONGE INTESTINAL PEANUT (DISPOSABLE) ×2 IMPLANT
SPONGE SURGIFOAM ABS GEL SZ50 (HEMOSTASIS) ×2 IMPLANT
STRIP CLOSURE SKIN 1/2X4 (GAUZE/BANDAGES/DRESSINGS) ×2 IMPLANT
SUT VIC AB 3-0 SH 8-18 (SUTURE) ×2 IMPLANT
SUT VICRYL 4-0 PS2 18IN ABS (SUTURE) ×2 IMPLANT
SYR 20ML ECCENTRIC (SYRINGE) ×2 IMPLANT
TAPE CLOTH 4X10 WHT NS (GAUZE/BANDAGES/DRESSINGS) IMPLANT
TOWEL OR 17X24 6PK STRL BLUE (TOWEL DISPOSABLE) ×2 IMPLANT
TOWEL OR 17X26 10 PK STRL BLUE (TOWEL DISPOSABLE) ×2 IMPLANT
TRAP SPECIMEN MUCOUS 40CC (MISCELLANEOUS) ×2 IMPLANT
WATER STERILE IRR 1000ML POUR (IV SOLUTION) ×2 IMPLANT

## 2013-12-25 NOTE — Anesthesia Preprocedure Evaluation (Signed)
Anesthesia Evaluation  Patient identified by MRN, date of birth, ID band Patient awake    Reviewed: Allergy & Precautions, H&P , NPO status , Patient's Chart, lab work & pertinent test results  Airway Mallampati: I TM Distance: >3 FB Neck ROM: Full    Dental   Pulmonary former smoker,          Cardiovascular     Neuro/Psych    GI/Hepatic   Endo/Other    Renal/GU      Musculoskeletal   Abdominal   Peds  Hematology   Anesthesia Other Findings   Reproductive/Obstetrics                           Anesthesia Physical Anesthesia Plan  ASA: II  Anesthesia Plan: General   Post-op Pain Management:    Induction: Intravenous  Airway Management Planned: Oral ETT  Additional Equipment:   Intra-op Plan:   Post-operative Plan: Extubation in OR  Informed Consent: I have reviewed the patients History and Physical, chart, labs and discussed the procedure including the risks, benefits and alternatives for the proposed anesthesia with the patient or authorized representative who has indicated his/her understanding and acceptance.     Plan Discussed with: CRNA and Surgeon  Anesthesia Plan Comments:         Anesthesia Quick Evaluation

## 2013-12-25 NOTE — Discharge Instructions (Signed)
No lifting no bending no twisting no driving a riding a car eyelashes comeback and forth to see me. Keep incision clean dry and intact. May remove the outer dressing in 2-3 days leave the Steri-Strips on and intact the cover the Steri-Strips with Saran wrap for showers only.

## 2013-12-25 NOTE — Anesthesia Procedure Notes (Signed)
Procedure Name: Intubation Date/Time: 12/25/2013 12:04 PM Performed by: Jacquiline Doe A Pre-anesthesia Checklist: Patient identified, Timeout performed, Emergency Drugs available, Suction available and Patient being monitored Patient Re-evaluated:Patient Re-evaluated prior to inductionOxygen Delivery Method: Circle system utilized Preoxygenation: Pre-oxygenation with 100% oxygen Intubation Type: IV induction and Cricoid Pressure applied Ventilation: Mask ventilation without difficulty Laryngoscope Size: Mac and 4 Grade View: Grade I Tube type: Oral Tube size: 7.5 mm Number of attempts: 1 Airway Equipment and Method: Stylet and LTA kit utilized Placement Confirmation: ETT inserted through vocal cords under direct vision,  breath sounds checked- equal and bilateral and positive ETCO2 Secured at: 22 cm Tube secured with: Tape Dental Injury: Teeth and Oropharynx as per pre-operative assessment

## 2013-12-25 NOTE — Transfer of Care (Signed)
Immediate Anesthesia Transfer of Care Note  Patient: Shannon Hines  Procedure(s) Performed: Procedure(s): ANTERIOR CERVICAL DECOMPRESSION/DISCECTOMY FUSION  CERVICAL FOUR-FIVE , FIVE-SIX (N/A)  Patient Location: PACU  Anesthesia Type:General  Level of Consciousness: awake, oriented, sedated, patient cooperative and responds to stimulation  Airway & Oxygen Therapy: Patient Spontanous Breathing and Patient connected to nasal cannula oxygen  Post-op Assessment: Report given to PACU RN, Post -op Vital signs reviewed and stable, Patient moving all extremities and Patient moving all extremities X 4  Post vital signs: Reviewed and stable  Complications: No apparent anesthesia complications

## 2013-12-25 NOTE — H&P (Signed)
Shannon Hines is an 28 y.o. female.   Chief Complaint: Neck right shoulder and arm pain HPI: Patient very pleasant 28 of them is a long-standing neck and bilateral shoulder and arm pain worse in the right this been refractory conservative treatment over the years and she been to physical therapy as well as trial multiple rounds of prednisone pain medication pain management. Due to her progression of clinical syndrome and imaging findings showed severe cervical spondylosis and stenosis at C4-5 and C5-6 I recommended anterior cervical discectomies and fusion C5 and C5-6. I extensively gone over the risks and benefits of the operation the patient as well as perioperative course and expectations of outcome and alternatives of surgery Shannon Hines stands and agrees to proceed forward.  Past Medical History  Diagnosis Date  . Degenerative disc disease   . Hypoglycemia   . Sprain of neck 10/28/2012  . Obesity   . Complication of anesthesia     Wakes up crying, and cold.  . Family history of anesthesia complication     Mother - nausea  . Dysrhythmia     PVC's - panic  attacks, stress test  . Migraine     Takes Pamelor as needed   . Headache(784.0) 10/28/2012  . Asthma     no problem in years- exercise induced  . Anxiety     Panic attacks  . IBS (irritable bowel syndrome)   . Cervical cancer     treated in MD 's office    Past Surgical History  Procedure Laterality Date  . Eye surgery Bilateral 1987    Strabismus surgery  . Nasal sinus surgery  2010  . Knee surgery  2007    ACL  . Colonoscopy    . Wisdom tooth extraction    . Upper gi endoscopy      Family History  Problem Relation Age of Onset  . Multiple sclerosis Mother   . Parkinsonism Father   . Hypertension Father   . Lung cancer Maternal Grandmother   . Cancer Paternal Grandfather   . Marfan syndrome Maternal Grandfather    Social History:  reports that she quit smoking about 3 years ago. She does not have any smokeless tobacco  history on file. She reports that she drinks alcohol. She reports that she does not use illicit drugs.  Allergies:  Allergies  Allergen Reactions  . Amoxicillin-Pot Clavulanate Other (See Comments)    REACTION: rectal bleeding , vomiting  Allergic  To Clavulante, not amoxicilin  . Adhesive [Tape] Other (See Comments)    Burns skin   . Cefdinir Other (See Comments)    Severe yeast infection    Medications Prior to Admission  Medication Sig Dispense Refill  . clonazePAM (KLONOPIN) 0.5 MG tablet Take 0.5 mg by mouth 3 (three) times daily as needed.      . cyclobenzaprine (FLEXERIL) 5 MG tablet Take 5 mg by mouth 2 (two) times daily as needed for muscle spasms.      Marland Kitchen etonogestrel (IMPLANON) 68 MG IMPL implant Inject 1 each into the skin once.      . meloxicam (MOBIC) 7.5 MG tablet Take 7.5 mg by mouth 2 (two) times daily as needed for pain.       . naproxen (NAPROSYN) 500 MG tablet Take 500 mg by mouth 2 (two) times daily with a meal.      . nortriptyline (PAMELOR) 25 MG capsule Take 50 mg by mouth at bedtime as needed for sleep.  Results for orders placed during the hospital encounter of 12/25/13 (from the past 48 hour(s))  BASIC METABOLIC PANEL     Status: Abnormal   Collection Time    12/24/13 10:45 AM      Result Value Ref Range   Sodium 139  137 - 147 mEq/L   Potassium 3.9  3.7 - 5.3 mEq/L   Chloride 101  96 - 112 mEq/L   CO2 26  19 - 32 mEq/L   Glucose, Bld 66 (*) 70 - 99 mg/dL   BUN 13  6 - 23 mg/dL   Creatinine, Ser 0.74  0.50 - 1.10 mg/dL   Calcium 9.2  8.4 - 10.5 mg/dL   GFR calc non Af Amer >90  >90 mL/min   GFR calc Af Amer >90  >90 mL/min   Comment: (NOTE)     The eGFR has been calculated using the CKD EPI equation.     This calculation has not been validated in all clinical situations.     eGFR's persistently <90 mL/min signify possible Chronic Kidney     Disease.   Anion gap 12  5 - 15  CBC     Status: None   Collection Time    12/24/13 10:45 AM       Result Value Ref Range   WBC 8.5  4.0 - 10.5 K/uL   RBC 4.64  3.87 - 5.11 MIL/uL   Hemoglobin 14.0  12.0 - 15.0 g/dL   HCT 41.7  36.0 - 46.0 %   MCV 89.9  78.0 - 100.0 fL   MCH 30.2  26.0 - 34.0 pg   MCHC 33.6  30.0 - 36.0 g/dL   RDW 12.4  11.5 - 15.5 %   Platelets 276  150 - 400 K/uL  HCG, SERUM, QUALITATIVE     Status: None   Collection Time    12/24/13 10:45 AM      Result Value Ref Range   Preg, Serum NEGATIVE  NEGATIVE   Comment:            THE SENSITIVITY OF THIS     METHODOLOGY IS >10 mIU/mL.  SURGICAL PCR SCREEN     Status: None   Collection Time    12/24/13 10:45 AM      Result Value Ref Range   MRSA, PCR NEGATIVE  NEGATIVE   Staphylococcus aureus NEGATIVE  NEGATIVE   Comment:            The Xpert SA Assay (FDA     approved for NASAL specimens     in patients over 35 years of age),     is one component of     a comprehensive surveillance     program.  Test performance has     been validated by [employer] for patients greater     than or equal to 94 year old.     It is not intended     to diagnose infection nor to     guide or monitor treatment.   No results found.  Review of Systems  Constitutional: Negative.   Eyes: Negative.   Respiratory: Negative.   Cardiovascular: Negative.   Gastrointestinal: Negative.   Genitourinary: Negative.   Musculoskeletal: Positive for joint pain, myalgias and neck pain.  Skin: Negative.   Neurological: Positive for sensory change.  Psychiatric/Behavioral: Negative.     Last menstrual period 11/19/2013. Physical Exam  Constitutional: She is oriented to person, place, and  time. She appears well-developed and well-nourished.  HENT:  Head: Normocephalic.  Eyes: Pupils are equal, round, and reactive to light.  Neck: Normal range of motion.  Respiratory: Effort normal.  GI: Soft. Bowel sounds are normal.  Neurological: She is alert and oriented to person, place, and time. She has normal strength. GCS eye subscore is  4. GCS verbal subscore is 5. GCS motor subscore is 6.  Reflex Scores:      Tricep reflexes are 2+ on the right side and 2+ on the left side.      Bicep reflexes are 2+ on the right side and 2+ on the left side.      Brachioradialis reflexes are 2+ on the right side and 2+ on the left side.      Patellar reflexes are 2+ on the right side and 2+ on the left side.      Achilles reflexes are 2+ on the right side and 2+ on the left side. Strength is 5 out of 5 in her deltoids, biceps, triceps, wrist flexion, wrist extension, and intrinsics.     Assessment/Plan 28yo presents for ACDF at C4-5 and C5-6.  Ever Gustafson P 12/25/2013, 11:51 AM

## 2013-12-25 NOTE — Op Note (Signed)
preoperative diagnosis: Cervical spondylosis with stenosis and radiculopathy C4-5 and C5-6 with right-sided C5 and C6 radiculopathies  Postoperative diagnosis: Same  Procedure: Anterior cervical discectomies and fusion at C4-5 and C5-6 using globus peek cages packed with locally harvested autograft mixed with allostem morsels and the globus extend plating system 32 mm plate with 782 mm variable-angle screws  Surgeon: Dominica Severin Satcha Storlie  Assistant: Sherley Bounds  Anesthesia: Gen.  EBL: Minimal  History of present illness: Patient verified 28 year female is a long-standing neck and right shoulder and arm pain is refractory to all forms of conservative treatment workup revealed cervical stenosis and spondylosis with cord compression and C5-C6 foraminal stenosis at C4-5 and C5-6 respectively due to patient's failure of conservative treatment imaging findings and progression of clinical syndrome I recommended anterior cervical discectomies and fusion at C4-5 and C5-6 I extensively reviewed the risks and benefits of the operation with the patient as well as perioperative course and expectations of outcome and alternatives surgery and she understood and agreed to proceed forward.  Operative procedure: Patient brought into the or was induced under general anesthesia positioned supine the neck in slight extension in 5 pounds of halter traction the right side of her neck was and prepped and draped in routine sterile fashion. Preoperative x-ray confirmed the appropriate level so a curvilinear incision was made just up midline to the interbody the sternomastoid and superficial layer of the platysma was divided longitudinally and the avascular plane between the sternocleidomastoid and the strap muscles was developed down to the prevertebral fascia and the prevertebral fascia facet with Kitners. Interoperative x-ray confirmed medication C4-5 disc space level so this was incised with a 15 blade scalpel marked the disc space  the lungs close affected laterally and self-retaining retractors placed. Both the space were further incised anterior osseous or bitten off with a 3 mm Kerrison punch using BA curette and pituitary rongeurs the anterior disc was removed a high-speed drill was used to drill down the posterior os it complexes capturing the bone shavings in a mucous trap then under microscopic illumination first working at C5-6 further drilling was carried out capturing the bone shavings in a mucous trap under biting of the endplates was then carried out with a 1 and 2 in the Kerrison punch the posterior longitudinal ligament was removed in piecemeal fashion exposing the thecal sac there was a large posterior spur coming off the C5 vertebral body causing severe thecal sac compression this was aggressively under bitten marking laterally the right C6 pedicle was identified and the right C6 nerve roots, social pedicle is an extensive amount of soft disc material as well as osteophyte in that foramen causing compression C6 nerve root. At the end of the discectomy both C6 nerve foramina widely patent easily excepting I nerve root in the foramen. In a similar fashion C4-5 was drilled down again posterior os sites were aggressively under bitten decompress the central canal both C5 nerve roots were identified there was a large posterior spur causing compression of the right C5 nerve root this was also aggressively under bitten then after adequate endplate preparation been achieved adequate decompression had been achieved decompressing all foramina 28 mm cages were packed with the aforementioned mixture and inserted 1-2 mm deep to the anterior vertebral body line. A 30 mm globus extend plate was placed all screws excellent purchase locking mechanism was engaged the wounds and copiously irrigated meticulous in space was maintained some additional bone chips were placed underneath the plate laterally then  the platysmas reapproximated with a with  interrupted Vicryl the skin was closed running 4 subcuticular, Dermabond benzo and Steri-Strips and sterile dressing was applied patient recovered in stable condition. At the end of case all needle counts sponge counts were correct.

## 2013-12-25 NOTE — Discharge Summary (Signed)
  Physician Discharge Summary  Patient ID: Shannon Hines MRN: [id number] DOB/AGE: 01-Nov-1985 28 y.o.  Admit date: 12/25/2013 Discharge date: 12/25/2013  Admission Diagnoses: C5-C6 radiculopathy right from stenosis and spondylosis C4-5 and C5-6  Discharge Diagnoses: Same Active Problems:   Spinal stenosis of cervical region   Discharged Conditigood240}  Hospital Course: Patient admitted hospital underwent an ACDF at C4-5 C5-6 postoperative recovered in the floor on the floor patient will be a willing and voiding spontaneously in order to be cleared for discharge him was scheduled followup approximately 2 weeks.  Consults: Significant Diagnostic Studies: Treatments: ACDF C5-C5 6 Discharge Exam: Blood pressure 123/81, pulse 95, temperature 98.5 F (36.9 C), resp. rate 17, last menstrual period 11/19/2013, SpO2 97.00%. Strength 5 out of 5 wound clean dry and intact  Disposition: Home     Medication List    STOP taking these medications       naproxen 500 MG tablet  Commonly known as:  NAPROSYN      TAKE these medications       cyclobenzaprine 10 MG tablet  Commonly known as:  FLEXERIL  Take 1 tablet (10 mg total) by mouth 3 (three) times daily as needed for muscle spasms.     cyclobenzaprine 5 MG tablet  Commonly known as:  FLEXERIL  Take 5 mg by mouth 2 (two) times daily as needed for muscle spasms.     etonogestrel 68 MG Impl implant  Commonly known as:  IMPLANON  Inject 1 each into the skin once.     KLONOPIN 0.5 MG tablet  Generic drug:  clonazePAM  Take 0.5 mg by mouth 3 (three) times daily as needed.     meloxicam 7.5 MG tablet  Commonly known as:  MOBIC  Take 7.5 mg by mouth 2 (two) times daily as needed for pain.     nortriptyline 25 MG capsule  Commonly known as:  PAMELOR  Take 50 mg by mouth at bedtime as needed for sleep.     Oxycodone HCl 10 MG Tabs  Commonly known as:  ROXICODONE  Take 1 tablet (10 mg total) by mouth every 4 (four) hours as  needed for moderate pain or severe pain.           Follow-up Information   Follow up with [facility] P, MD.   Specialty:  Neurosurgery   Contact information:   [address] [place] [address] [phone]       Signed: Norma Montemurro P 12/25/2013, 3:58 PM

## 2013-12-25 NOTE — Anesthesia Postprocedure Evaluation (Signed)
Anesthesia Post Note  Patient: Shannon Hines  Procedure(s) Performed: Procedure(s) (LRB): ANTERIOR CERVICAL DECOMPRESSION/DISCECTOMY FUSION  CERVICAL FOUR-FIVE , FIVE-SIX (N/A)  Anesthesia type: general  Patient location: PACU  Post pain: Pain level controlled  Post assessment: Patient's Cardiovascular Status Stable  Last Vitals:  Filed Vitals:   12/25/13 1600  BP:   Pulse: 94  Temp: 36.5 C  Resp: 17    Post vital signs: Reviewed and stable  Level of consciousness: sedated  Complications: No apparent anesthesia complications

## 2013-12-28 ENCOUNTER — Encounter (HOSPITAL_COMMUNITY): Payer: Self-pay | Admitting: Neurosurgery

## 2015-06-19 ENCOUNTER — Emergency Department
Admission: EM | Admit: 2015-06-19 | Discharge: 2015-06-19 | Disposition: A | Discharge status: Home / Self Care | Payer: Managed Care, Other (non HMO) | Source: Home / Self Care | Attending: Family Medicine | Admitting: Family Medicine

## 2015-06-19 DIAGNOSIS — J069 Acute upper respiratory infection, unspecified: Secondary | ICD-10-CM | POA: Diagnosis not present

## 2015-06-19 DIAGNOSIS — N898 Other specified noninflammatory disorders of vagina: Secondary | ICD-10-CM | POA: Diagnosis not present

## 2015-06-19 DIAGNOSIS — R300 Dysuria: Secondary | ICD-10-CM | POA: Diagnosis not present

## 2015-06-19 MED ORDER — PHENAZOPYRIDINE HCL 200 MG PO TABS: 200.0000 mg | ORAL_TABLET | Freq: Three times a day (TID) | ORAL | Status: DC

## 2015-06-19 NOTE — Discharge Instructions (Signed)
You may take 400-600mg  Ibuprofen (Motrin) every 6-8 hours for fever and pain  Alternate with Tylenol  You may take 500mg  Tylenol every 4-6 hours as needed for fever and pain  Follow-up with your primary care provider next week for recheck of symptoms if not improving.  Be sure to drink plenty of fluids and rest, at least 8hrs of sleep a night, preferably more while you are sick. Return urgent care or go to closest ER if you cannot keep down fluids/signs of dehydration, fever not reducing with Tylenol, difficulty breathing/wheezing, stiff neck, worsening condition, or other concerns (see below)  Please continue to take your antibiotics as prescribed and be sure to complete entire course even if you start to feel better to ensure infection does not come back.  You may take Pyridium as prescribed, or you may try an over the counter medication called phenazopyridine (e.g. Azo, Baridium or pyridium) for urinary discomfort and bladder spasms.  Please ask your pharmacist where to find this medication at your local pharmacy.  The medication does turn your urine orange, this is normal.  Do not take more than is indicated on the packaging.    A wet prep- test for yeast and bacterial vaginosis, performed today will come back in 2-3 days and if medication is determined to be needed, it can be called in for you.   Blood work was attempted today but unfortunately we could not get any blood to test.  Please come back to [facility] and go to the lab downstairs for your blood draw, or call your primary care provider to see if they want to see you tomorrow and get blood at their office.

## 2015-06-19 NOTE — ED Provider Notes (Signed)
CSN: [id number]     Arrival date & time 06/19/15  1112 History   First MD Initiated Contact with Patient 06/19/15 1131     Chief Complaint  Patient presents with  . Urinary Frequency   (Consider location/radiation/quality/duration/timing/severity/associated sxs/prior Treatment) HPI  The pt is a 30yo female presenting to [facility] with c/o dysuria and bladder spasms intermittently for 1 month.  She was seen by her PCP last week and started on Levaquin as she also had URI symptoms.  She has 4 days left of a 10 day course.  URI symptoms have improved but she still reports feeling drained and having burning with urination.  She has used OTC yeast-infection treatment "just in case" and symptoms did go away from a little bit but then returned. She was also given 1 dose of Diflucan yesterday for possible yeast infection due to being on the Levaquin.  Denies fever, chills, n/v/d the last 2 days but did have fever and chills earlier this week.  She reports prior hx of kidney stones but states this is more burning at opening of vaginal wall and urethra.  Denies concern for STDs as she reports not having intercourse in over 1 year.   Past Medical History  Diagnosis Date  . Degenerative disc disease   . Hypoglycemia   . Sprain of neck 10/28/2012  . Obesity   . Complication of anesthesia     Wakes up crying, and cold.  . Family history of anesthesia complication     Mother - nausea  . Dysrhythmia     PVC's - panic  attacks, stress test  . Migraine     Takes Pamelor as needed   . Headache(784.0) 10/28/2012  . Asthma     no problem in years- exercise induced  . Anxiety     Panic attacks  . IBS (irritable bowel syndrome)   . Cervical cancer [facility])     treated in MD 's office   Past Surgical History  Procedure Laterality Date  . Eye surgery Bilateral 1987    Strabismus surgery  . Nasal sinus surgery  2010  . Knee surgery  2007    ACL  . Colonoscopy    . Wisdom tooth extraction    . Upper gi endoscopy     . Anterior cervical decomp/discectomy fusion N/A 12/25/2013    Procedure: ANTERIOR CERVICAL DECOMPRESSION/DISCECTOMY FUSION  CERVICAL FOUR-FIVE , FIVE-SIX;  Surgeon: Elaina Hoops, MD;  Location: [place] [facility];  Service: Neurosurgery;  Laterality: N/A;   Family History  Problem Relation Age of Onset  . Multiple sclerosis Mother   . Parkinsonism Father   . Hypertension Father   . Lung cancer Maternal Grandmother   . Cancer Paternal Grandfather   . Marfan syndrome Maternal Grandfather    Social History  Substance Use Topics  . Smoking status: Former Smoker    Quit date: 09/24/2010  . Smokeless tobacco: None  . Alcohol Use: Yes     Comment: Socially   OB History    No data available     Review of Systems  Constitutional: Positive for fever, chills and fatigue.  HENT: Positive for congestion and sore throat. Negative for ear pain, trouble swallowing and voice change.   Respiratory: Positive for cough. Negative for shortness of breath.   Cardiovascular: Negative for chest pain and palpitations.  Gastrointestinal: Negative for nausea, vomiting, abdominal pain and diarrhea.  Genitourinary: Positive for dysuria and frequency. Negative for hematuria and flank pain.  Musculoskeletal: Negative  for myalgias, back pain and arthralgias.  Skin: Negative for rash.  Neurological: Positive for headaches. Negative for dizziness and light-headedness.    Allergies  Amoxicillin-pot clavulanate; Adhesive; and Cefdinir  Home Medications   Prior to Admission medications   Medication Sig Start Date End Date Taking? Authorizing Provider  clonazePAM (KLONOPIN) 0.5 MG tablet Take 0.5 mg by mouth 3 (three) times daily as needed. 09/22/13   Historical Provider, MD  cyclobenzaprine (FLEXERIL) 10 MG tablet Take 1 tablet (10 mg total) by mouth 3 (three) times daily as needed for muscle spasms. 12/25/13   Kary Kos, MD  cyclobenzaprine (FLEXERIL) 5 MG tablet Take 5 mg by mouth 2 (two) times daily as needed  for muscle spasms.    Historical Provider, MD  etonogestrel (IMPLANON) 68 MG IMPL implant Inject 1 each into the skin once.    Historical Provider, MD  meloxicam (MOBIC) 7.5 MG tablet Take 7.5 mg by mouth 2 (two) times daily as needed for pain.     Historical Provider, MD  nortriptyline (PAMELOR) 25 MG capsule Take 50 mg by mouth at bedtime as needed for sleep.    Historical Provider, MD  Oxycodone HCl (ROXICODONE) 10 MG TABS Take 1 tablet (10 mg total) by mouth every 4 (four) hours as needed for moderate pain or severe pain. 12/25/13   Kary Kos, MD  phenazopyridine (PYRIDIUM) 200 MG tablet Take 1 tablet (200 mg total) by mouth 3 (three) times daily. 06/19/15   Noland Fordyce, PA-C   Meds Ordered and Administered this Visit  Medications - No data to display  BP 118/78 mmHg  Pulse 86  Temp(Src) 98.1 F (36.7 C) (Oral)  Ht 5\' 4"  (1.626 m)  Wt 207 lb (93.895 kg)  BMI 35.51 kg/m2  SpO2 95% No data found.   Physical Exam  Constitutional: She appears well-developed and well-nourished. No distress.  HENT:  Head: Normocephalic and atraumatic.  Right Ear: Tympanic membrane normal.  Left Ear: Tympanic membrane normal.  Nose: Nose normal.  Mouth/Throat: Uvula is midline, oropharynx is clear and moist and mucous membranes are normal.  Eyes: Conjunctivae are normal. No scleral icterus.  Neck: Normal range of motion. Neck supple.  Cardiovascular: Normal rate, regular rhythm and normal heart sounds.   Pulmonary/Chest: Effort normal and breath sounds normal. No respiratory distress. She has no wheezes. She has no rales. She exhibits no tenderness.  Abdominal: Soft. Bowel sounds are normal. She exhibits no distension and no mass. There is no tenderness. There is CVA tenderness (Right). There is no rebound and no guarding.  Genitourinary: Pelvic exam was performed with patient supine. Cervix exhibits discharge. Cervix exhibits no motion tenderness and no friability. Right adnexum displays no mass, no  tenderness and no fullness. Left adnexum displays no mass, no tenderness and no fullness. There is erythema in the vagina. No bleeding in the vagina. Vaginal discharge found.  Chaperoned exam. Mild erythema of external genitalia. Non-tender. No lesions.  Mild to moderate amount of white discharge in vaginal canal. No bleeding. No CMT, adnexal tenderness or masses.   Musculoskeletal: Normal range of motion.  Neurological: She is alert.  Skin: Skin is warm and dry. She is not diaphoretic.  Nursing note and vitals reviewed.   ED Course  Procedures (including critical care time)  Labs Review Labs Reviewed  URINE CULTURE  WET PREP BY MOLECULAR PROBE  COMPREHENSIVE METABOLIC PANEL  CBC WITH DIFFERENTIAL/PLATELET    Imaging Review No results found.   MDM   1. Dysuria  2. Vaginal irritation   3. Acute upper respiratory infection    Pt c/o dysuria despite being on Levaquin for 6 days.  Pt also being treated for URI symptoms. Lungs: CTAB. Pt is afebrile, moist mucous membranes. Right sided CVA tenderness noted on exam. Pt has hx of kidney stones.  Advised U/S not available at [facility] at this time, may send to [facility] for imaging.  Pt declined renal U/S today. She is still able to pass urine, agree pt does not need emergent imaging at this time.  UA not performed due to recent use of Pyridium. Will send culture. Pelvic exam- c/w possible yeast infection.  Wet prep sent.  Pt had 1 dose of Diflucan yesterday, will call in more Diflucan if needed depending on results that come back in 2-3 days.    CBC and CMP ordered.  Unable to draw blood in UC.  Pt advised to come back to get labs drawn tomorrow or may call her PCP to see if they want to see her tomorrow for the bloodwork, or other testing.  Rx: Pyridium Encouraged good hydration.  F/u with PCP this week for recheck of symptoms and to review labs. May need referral to urology or OB/GYN Discussed symptoms that warrant emergent care in the  ED. Patient verbalized understanding and agreement with treatment plan.     Noland Fordyce, PA-C 06/19/15 1413

## 2015-06-19 NOTE — ED Notes (Signed)
Problems have been going onfor over a month.  Frequency, pain, bladder spasms, burning.  Has seen primary care MD, and he urged her to come to [place] for followup per patient.

## 2015-06-20 LAB — COMPREHENSIVE METABOLIC PANEL
ALT: 11 U/L (ref 6–29)
AST: 14 U/L (ref 10–30)
Albumin: 3.8 g/dL (ref 3.6–5.1)
Alkaline Phosphatase: 69 U/L (ref 33–115)
BUN: 13 mg/dL (ref 7–25)
CO2: 25 mmol/L (ref 20–31)
Calcium: 8.7 mg/dL (ref 8.6–10.2)
Chloride: 104 mmol/L (ref 98–110)
Creat: 0.68 mg/dL (ref 0.50–1.10)
Glucose, Bld: 95 mg/dL (ref 65–99)
Potassium: 4.3 mmol/L (ref 3.5–5.3)
Sodium: 139 mmol/L (ref 135–146)
Total Bilirubin: 0.5 mg/dL (ref 0.2–1.2)
Total Protein: 6.4 g/dL (ref 6.1–8.1)

## 2015-06-20 LAB — CBC WITH DIFFERENTIAL/PLATELET
Basophils Absolute: 0 10*3/uL (ref 0.0–0.1)
Basophils Relative: 0 % (ref 0–1)
Eosinophils Absolute: 0.2 10*3/uL (ref 0.0–0.7)
Eosinophils Relative: 3 % (ref 0–5)
HCT: 36.9 % (ref 36.0–46.0)
Hemoglobin: 12.2 g/dL (ref 12.0–15.0)
Lymphocytes Relative: 32 % (ref 12–46)
Lymphs Abs: 2.2 10*3/uL (ref 0.7–4.0)
MCH: 28.8 pg (ref 26.0–34.0)
MCHC: 33.1 g/dL (ref 30.0–36.0)
MCV: 87.2 fL (ref 78.0–100.0)
MPV: 9.4 fL (ref 8.6–12.4)
Monocytes Absolute: 0.5 10*3/uL (ref 0.1–1.0)
Monocytes Relative: 7 % (ref 3–12)
Neutro Abs: 3.9 10*3/uL (ref 1.7–7.7)
Neutrophils Relative %: 58 % (ref 43–77)
Platelets: 295 10*3/uL (ref 150–400)
RBC: 4.23 MIL/uL (ref 3.87–5.11)
RDW: 12.4 % (ref 11.5–15.5)
WBC: 6.8 10*3/uL (ref 4.0–10.5)

## 2015-06-20 LAB — WET PREP BY MOLECULAR PROBE
Candida species: NEGATIVE
Gardnerella vaginalis: NEGATIVE
Trichomonas vaginosis: NEGATIVE

## 2015-06-20 LAB — URINE CULTURE
Colony Count: NO GROWTH
Organism ID, Bacteria: NO GROWTH

## 2015-06-21 ENCOUNTER — Telehealth: Payer: Self-pay | Admitting: *Deleted

## 2021-01-23 ENCOUNTER — Emergency Department (INDEPENDENT_AMBULATORY_CARE_PROVIDER_SITE_OTHER)
Admission: EM | Admit: 2021-01-23 | Discharge: 2021-01-23 | Disposition: A | Discharge status: Home / Self Care | Payer: BLUE CROSS/BLUE SHIELD | Source: Home / Self Care | Attending: Family Medicine | Admitting: Family Medicine

## 2021-01-23 ENCOUNTER — Encounter: Payer: Self-pay | Admitting: Emergency Medicine

## 2021-01-23 ENCOUNTER — Other Ambulatory Visit: Payer: Self-pay

## 2021-01-23 DIAGNOSIS — J029 Acute pharyngitis, unspecified: Secondary | ICD-10-CM

## 2021-01-23 DIAGNOSIS — J069 Acute upper respiratory infection, unspecified: Secondary | ICD-10-CM

## 2021-01-23 LAB — POCT URINALYSIS DIP (MANUAL ENTRY)
Bilirubin, UA: NEGATIVE
Blood, UA: NEGATIVE
Glucose, UA: NEGATIVE mg/dL
Ketones, POC UA: NEGATIVE mg/dL
Leukocytes, UA: NEGATIVE
Nitrite, UA: NEGATIVE
Protein Ur, POC: NEGATIVE mg/dL
Spec Grav, UA: 1.02 (ref 1.010–1.025)
Urobilinogen, UA: 0.2 E.U./dL
pH, UA: 7 (ref 5.0–8.0)

## 2021-01-23 LAB — POCT RAPID STREP A (OFFICE): Rapid Strep A Screen: NEGATIVE

## 2021-01-23 MED ORDER — AZITHROMYCIN 250 MG PO TABS: ORAL_TABLET | ORAL | 0 refills | Status: AC

## 2021-01-23 MED ORDER — ALBUTEROL SULFATE HFA 108 (90 BASE) MCG/ACT IN AERS: 2.0000 | INHALATION_SPRAY | Freq: Four times a day (QID) | RESPIRATORY_TRACT | 0 refills | Status: AC | PRN

## 2021-01-23 MED ORDER — PREDNISONE 20 MG PO TABS: ORAL_TABLET | ORAL | 0 refills | Status: AC

## 2021-01-23 NOTE — ED Provider Notes (Signed)
[facility]    CSN: [id number] Arrival date & time: 01/23/21  1258      History   Chief Complaint Chief Complaint  Patient presents with   Sore Throat    HPI Shannon Hines is a 35 y.o. female.   Patient developed sore throat and soreness in her neck three days ago.  She has also had mild headache, myalgias, vague abdominal cramps, loose stools and a non-productive cough.  She has developed mild wheezing at night (she has history of asthma, well controlled without medications).  She notes that her child has a URI. She has a past history of pneumonia.  The history is provided by the patient.   Past Medical History:  Diagnosis Date   Anxiety    Panic attacks   Asthma    no problem in years- exercise induced   Cervical cancer ([place])    treated in MD 's office   Complication of anesthesia    Wakes up crying, and cold.   Degenerative disc disease    Dysrhythmia    PVC's - panic  attacks, stress test   Family history of anesthesia complication    Mother - nausea   Headache(784.0) 10/28/2012   Hypoglycemia    IBS (irritable bowel syndrome)    Migraine    Takes Pamelor as needed    Obesity    Sprain of neck 10/28/2012    Patient Active Problem List   Diagnosis Date Noted   Spinal stenosis of cervical region 12/25/2013   Sprain of neck 10/28/2012   Headache(784.0) 10/28/2012   ACUTE MAXILLARY SINUSITIS 07/01/2009   BRONCHITIS, ACUTE WITH MILD BRONCHOSPASM 07/01/2009   TMJ SYNDROME 07/01/2009   DYSURIA 07/01/2009    Past Surgical History:  Procedure Laterality Date   ANTERIOR CERVICAL DECOMP/DISCECTOMY FUSION N/A 12/25/2013   Procedure: ANTERIOR CERVICAL DECOMPRESSION/DISCECTOMY FUSION  CERVICAL FOUR-FIVE , FIVE-SIX;  Surgeon: Elaina Hoops, MD;  Location: [place] [facility];  Service: Neurosurgery;  Laterality: N/A;   COLONOSCOPY     EYE SURGERY Bilateral 1987   Strabismus surgery   KNEE SURGERY  2007   ACL   NASAL SINUS SURGERY  2010   UPPER GI ENDOSCOPY      WISDOM TOOTH EXTRACTION      OB History   No obstetric history on file.      Home Medications    Prior to Admission medications   Medication Sig Start Date End Date Taking? Authorizing Provider  albuterol (VENTOLIN HFA) 108 (90 Base) MCG/ACT inhaler Inhale 2 puffs into the lungs every 6 (six) hours as needed for wheezing or shortness of breath. 01/23/21  Yes Kandra Nicolas, MD  azithromycin (ZITHROMAX Z-PAK) 250 MG tablet Take 2 tabs today; then begin one tab once daily for 4 more days. 01/23/21  Yes Kandra Nicolas, MD  dicyclomine (BENTYL) 10 MG capsule Take 10 mg by mouth 4 (four) times daily -  before meals and at bedtime.   Yes [provider]  predniSONE (DELTASONE) 20 MG tablet Take one tab by mouth twice daily for 4 days, then one daily. Take with food. 01/23/21  Yes Kandra Nicolas, MD  clonazePAM (KLONOPIN) 0.5 MG tablet Take 0.5 mg by mouth 3 (three) times daily as needed. 09/22/13   [provider]  cyclobenzaprine (FLEXERIL) 10 MG tablet Take 1 tablet (10 mg total) by mouth 3 (three) times daily as needed for muscle spasms. 12/25/13   Kary Kos, MD  cyclobenzaprine (FLEXERIL) 5 MG tablet  Take 5 mg by mouth 2 (two) times daily as needed for muscle spasms.    [provider]  meloxicam (MOBIC) 7.5 MG tablet Take 7.5 mg by mouth 2 (two) times daily as needed for pain.     [provider]  nortriptyline (PAMELOR) 25 MG capsule Take 50 mg by mouth at bedtime as needed for sleep.    [provider]    Family History Family History  Problem Relation Age of Onset   Multiple sclerosis Mother    Parkinsonism Father    Hypertension Father    Lung cancer Maternal Grandmother    Cancer Paternal Grandfather    Marfan syndrome Maternal Grandfather     Social History Social History   Tobacco Use   Smoking status: Former    Types: Cigarettes    Quit date: 09/24/2010    Years since quitting: 10.3   Smokeless tobacco: Never  Vaping  Use   Vaping Use: Never used  Substance Use Topics   Alcohol use: Yes    Comment: Socially   Drug use: No     Allergies   Amoxicillin-pot clavulanate, Adhesive [tape], and Cefdinir   Review of Systems Review of Systems + sore throat + cough No pleuritic pain + wheezing No nasal congestion No post-nasal drainage No sinus pain/pressure No itchy/red eyes No earache No hemoptysis No SOB No fever/chills No nausea No vomiting + abdominal pain + diarrhea, resolved No urinary symptoms No skin rash + fatigue + myalgias + headache Used OTC meds (Nyquil, Dayquil, Mucinex, ibuprofen) without relief   Physical Exam Triage Vital Signs ED Triage Vitals  Enc Vitals Group     BP 01/23/21 1521 119/79     Pulse Rate 01/23/21 1521 75     Resp 01/23/21 1521 18     Temp 01/23/21 1521 98.2 F (36.8 C)     Temp Source 01/23/21 1521 Oral     SpO2 01/23/21 1521 99 %     Weight 01/23/21 1522 190 lb (86.2 kg)     Height 01/23/21 1522 5\' 3"  (1.6 m)     Head Circumference --      Peak Flow --      Pain Score 01/23/21 1522 8     Pain Loc --      Pain Edu? --      Excl. in [place]? --    No data found.  Updated Vital Signs BP 119/79 (BP Location: Left Arm)   Pulse 75   Temp 98.2 F (36.8 C) (Oral)   Resp 18   Ht 5\' 3"  (1.6 m)   Wt 86.2 kg   LMP 11/19/2013 Comment: implant, neg preg test 12/24/13  SpO2 99%   BMI 33.66 kg/m   Visual Acuity Right Eye Distance:   Left Eye Distance:   Bilateral Distance:    Right Eye Near:   Left Eye Near:    Bilateral Near:     Physical Exam Nursing notes and Vital Signs reviewed. Appearance:  Patient appears stated age, and in no acute distress Eyes:  Pupils are equal, round, and reactive to light and accomodation.  Extraocular movement is intact.  Conjunctivae are not inflamed  Ears:  Canals normal.  Tympanic membranes normal.  Nose:  Mildly congested turbinates.  No sinus tenderness.   Pharynx:  Normal Neck:  Supple.  Mildly enlarged  lateral nodes are present, tender to palpation on the left.   Lungs:  Clear to auscultation.  Breath sounds are equal.  Moving air well. Heart:  Regular rate and rhythm without murmurs, rubs, or gallops.  Abdomen:  Nontender without masses or hepatosplenomegaly.  Bowel sounds are present.  No CVA or flank tenderness.  Extremities:  No edema.  Skin:  No rash present.   UC Treatments / Results  Labs (all labs ordered are listed, but only abnormal results are displayed) Labs Reviewed  STREP A DNA PROBE  NOVEL CORONAVIRUS, NAA  POCT RAPID STREP A (OFFICE) negative  POCT URINALYSIS DIP (MANUAL ENTRY) Component Ref Range & Units 1 d ago 11 yr ago  Color, UA yellow yellow    Clarity, UA clear clear    Glucose, UA negative mg/dL negative    Bilirubin, UA negative negative    Ketones, POC UA negative mg/dL negative    Spec Grav, UA 1.010 - 1.025 1.020    Blood, UA negative negative    pH, UA 5.0 - 8.0 7.0    Protein Ur, POC negative mg/dL negative    Urobilinogen, UA 0.2 or 1.0 E.U./dL 0.2  1.0 R   Nitrite, UA Negative Negative    Leukocytes, UA Negative Negative        EKG   Radiology No results found.  Procedures Procedures (including critical care time)  Medications Ordered in UC Medications - No data to display  Initial Impression / Assessment and Plan / UC Course  I have reviewed the triage vital signs and the nursing notes.  Pertinent labs & imaging results that were available during my care of the patient were reviewed by me and considered in my medical decision making (see chart for details).    Begin prednisone burst/taper.  Rx for albuterol inhaler. Followup with Family Doctor if not improved in about 10 days.  Final Clinical Impressions(s) / UC Diagnoses   Final diagnoses:  Acute pharyngitis, unspecified etiology  Viral URI with cough     Discharge Instructions      Take plain guaifenesin (1200mg  extended release tabs such as Mucinex) twice daily, with  plenty of water, for cough and congestion.  May add Pseudoephedrine (30mg , one or two every 4 to 6 hours) for sinus congestion.  Get adequate rest.   May use Afrin nasal spray (or generic oxymetazoline) each morning for about 5 days and then discontinue.  Also recommend using saline nasal spray several times daily and saline nasal irrigation (AYR is a common brand).  Use Flonase nasal spray each morning after using Afrin nasal spray and saline nasal irrigation. Try warm salt water gargles for sore throat.  Stop all antihistamines (Nyquil, etc) for now, and other non-prescription cough/cold preparations. May take Delsym Cough Suppressant ("12 Hour Cough Relief") at bedtime for nighttime cough.  Begin Azithromycin if not improving about one week or if persistent fever develops  (Given a prescription to hold, with an expiration date)       ED Prescriptions     Medication Sig Dispense Auth. Provider   predniSONE (DELTASONE) 20 MG tablet Take one tab by mouth twice daily for 4 days, then one daily. Take with food. 12 tablet Kandra Nicolas, MD   albuterol (VENTOLIN HFA) 108 (90 Base) MCG/ACT inhaler Inhale 2 puffs into the lungs every 6 (six) hours as needed for wheezing or shortness of breath. 8 g Kandra Nicolas, MD   azithromycin (ZITHROMAX Z-PAK) 250 MG tablet Take 2 tabs today; then begin one tab once daily for 4 more days. 6 tablet Kandra Nicolas, MD  Kandra Nicolas, MD 01/24/21 1719

## 2021-01-23 NOTE — Discharge Instructions (Signed)
Take plain guaifenesin (1200mg  extended release tabs such as Mucinex) twice daily, with plenty of water, for cough and congestion.  May add Pseudoephedrine (30mg , one or two every 4 to 6 hours) for sinus congestion.  Get adequate rest.   May use Afrin nasal spray (or generic oxymetazoline) each morning for about 5 days and then discontinue.  Also recommend using saline nasal spray several times daily and saline nasal irrigation (AYR is a common brand).  Use Flonase nasal spray each morning after using Afrin nasal spray and saline nasal irrigation. Try warm salt water gargles for sore throat.  Stop all antihistamines (Nyquil, etc) for now, and other non-prescription cough/cold preparations. May take Delsym Cough Suppressant ("12 Hour Cough Relief") at bedtime for nighttime cough.  Begin Azithromycin if not improving about one week or if persistent fever develops

## 2021-01-23 NOTE — ED Triage Notes (Signed)
Sore throat, abdominal cramps x 3 days Sick children in the house. Unvaccinated

## 2021-01-24 LAB — SARS-COV-2, NAA 2 DAY TAT

## 2021-01-24 LAB — NOVEL CORONAVIRUS, NAA: SARS-CoV-2, NAA: NOT DETECTED

## 2021-01-25 LAB — CULTURE, GROUP A STREP
MICRO NUMBER:: 12574738
SPECIMEN QUALITY:: ADEQUATE

## 2021-08-28 ENCOUNTER — Other Ambulatory Visit: Payer: Self-pay | Admitting: Neurosurgery

## 2021-08-28 DIAGNOSIS — M542 Cervicalgia: Secondary | ICD-10-CM

## 2021-09-03 ENCOUNTER — Ambulatory Visit (INDEPENDENT_AMBULATORY_CARE_PROVIDER_SITE_OTHER): Payer: BLUE CROSS/BLUE SHIELD

## 2021-09-03 DIAGNOSIS — M542 Cervicalgia: Secondary | ICD-10-CM | POA: Diagnosis not present

## 2021-10-10 ENCOUNTER — Other Ambulatory Visit: Payer: Self-pay | Admitting: Neurosurgery

## 2021-10-10 DIAGNOSIS — M25511 Pain in right shoulder: Secondary | ICD-10-CM

## 2021-10-15 ENCOUNTER — Ambulatory Visit (INDEPENDENT_AMBULATORY_CARE_PROVIDER_SITE_OTHER): Payer: BLUE CROSS/BLUE SHIELD

## 2021-10-15 DIAGNOSIS — M25511 Pain in right shoulder: Secondary | ICD-10-CM

## 2022-07-22 IMAGING — MR MR CERVICAL SPINE W/O CM
5 series · 41 of 48 positions shown · non-contrast
Comparison: Prior MRI from 11/28/2013.

CLINICAL DATA: Initial evaluation for neck pain with right upper
extremity issues 4 months. History of prior fusion.

EXAM:
MRI CERVICAL SPINE WITHOUT CONTRAST
TECHNIQUE: Multiplanar, multisequence MR imaging of the cervical spine was
performed. No intravenous contrast was administered.

[Series 2: T2 · sagittal · 3.0mm · 0.86mm/px · 6 of 13 slices shown (1 of 2)]
[im 1/13]
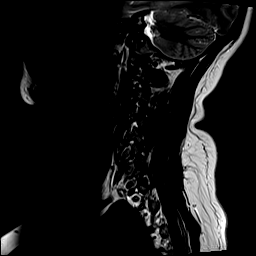
[im 3/13]
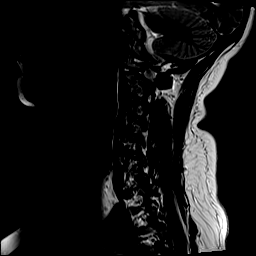
[im 5/13]
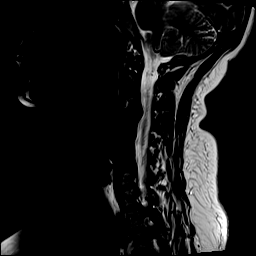
[im 8/13]
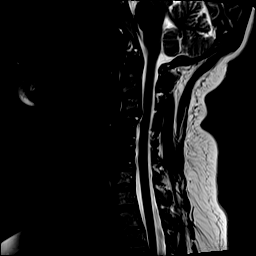
[im 10/13]
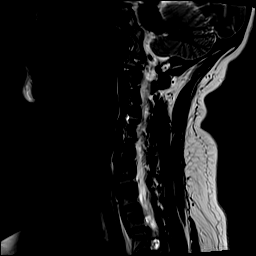
[im 13/13]
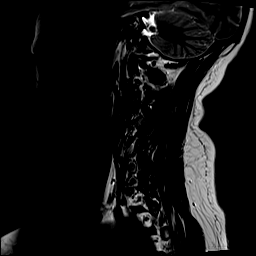

[Series 3: T1 · sagittal · 3.0mm · 0.86mm/px · 6 of 13 slices shown]
[im 1/13]
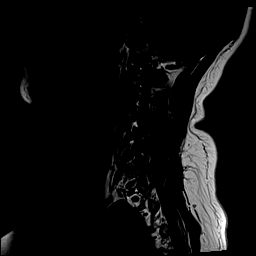
[im 3/13]
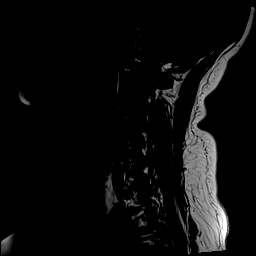
[im 5/13]
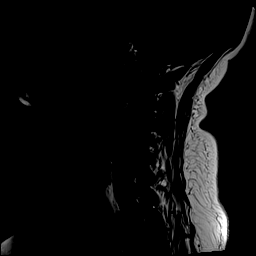
[im 8/13]
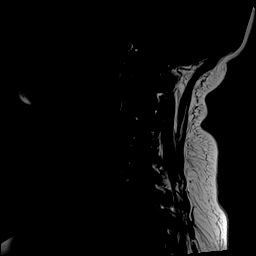
[im 10/13]
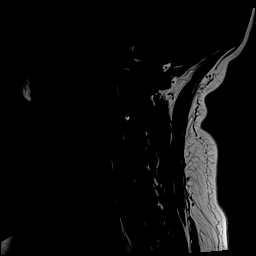
[im 13/13]
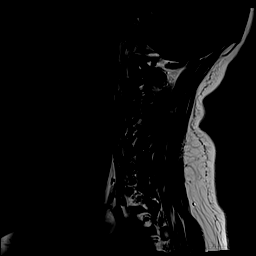

[Series 4: STIR · sagittal · 3.0mm · 0.86mm/px · 6 of 13 slices shown]
[im 1/13]
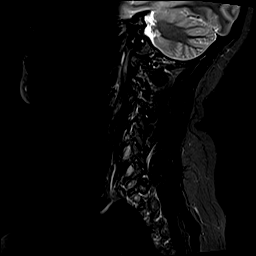
[im 3/13]
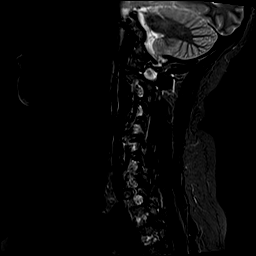
[im 5/13]
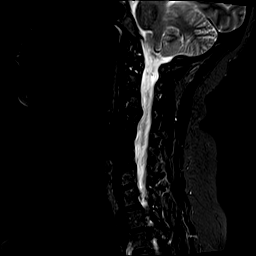
[im 8/13]
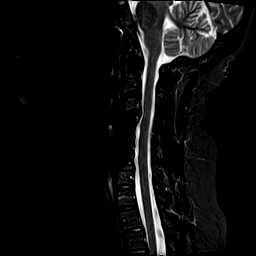
[im 10/13]
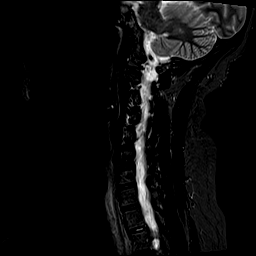
[im 13/13]
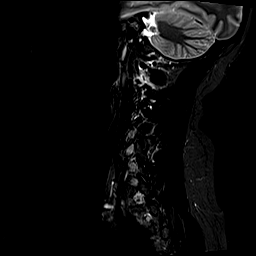

[Series 5: T2 · axial · 3.0mm · 0.70mm/px · z∈[-70,+39]mm · 15 of 34 slices shown (2 of 2)]
[im 1/34]
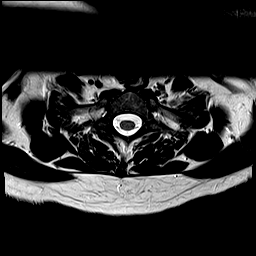
[im 3/34]
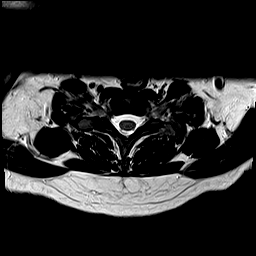
[im 5/34]
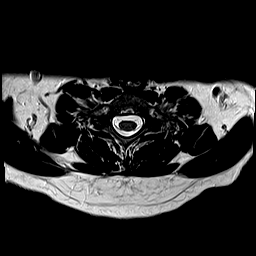
[im 8/34]
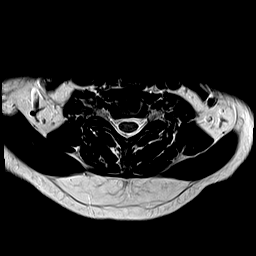
[im 10/34]
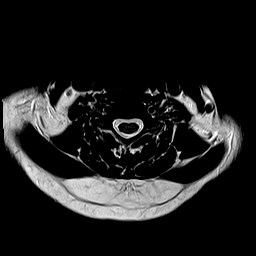
[im 12/34]
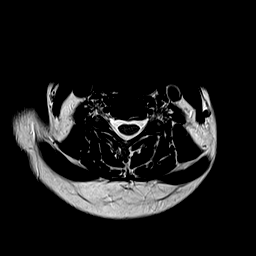
[im 15/34]
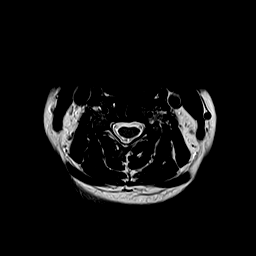
[im 17/34]
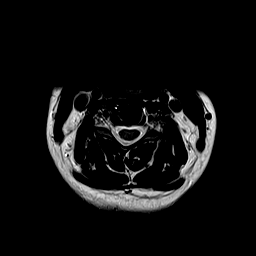
[im 19/34]
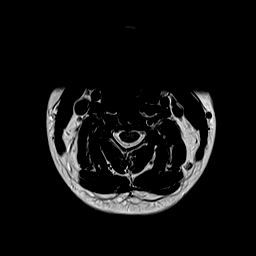
[im 22/34]
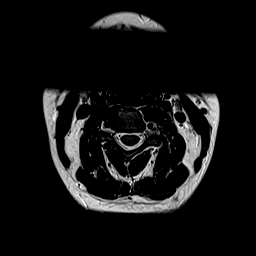
[im 24/34]
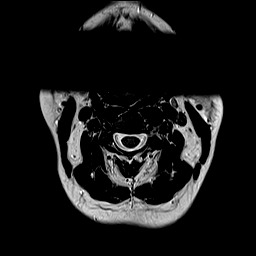
[im 26/34]
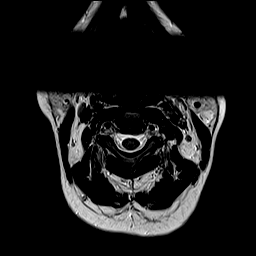
[im 29/34]
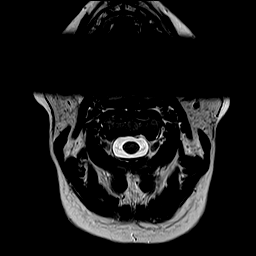
[im 31/34]
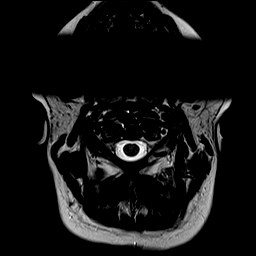
[im 34/34]
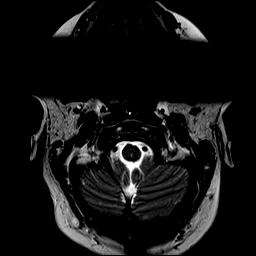

[Series 6: ax mpgr · axial · 3.0mm · 0.35mm/px · z∈[-70,+39]mm · 8 of 34 slices shown]
[im 1/34]
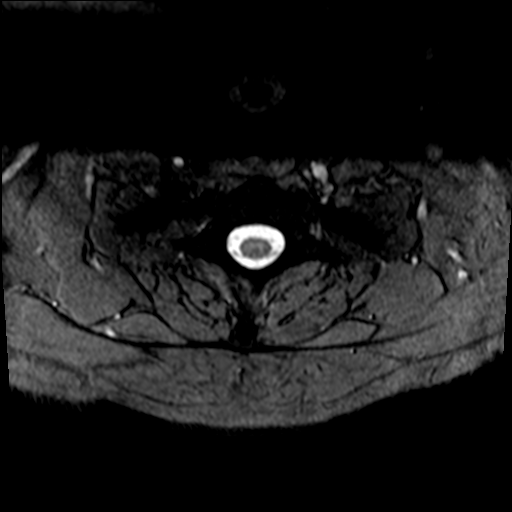
[im 5/34]
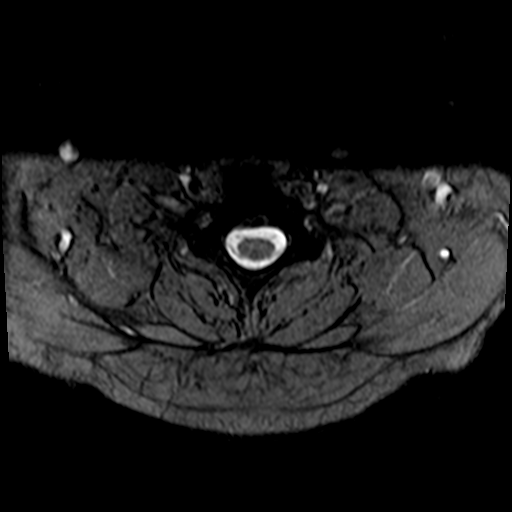
[im 10/34]
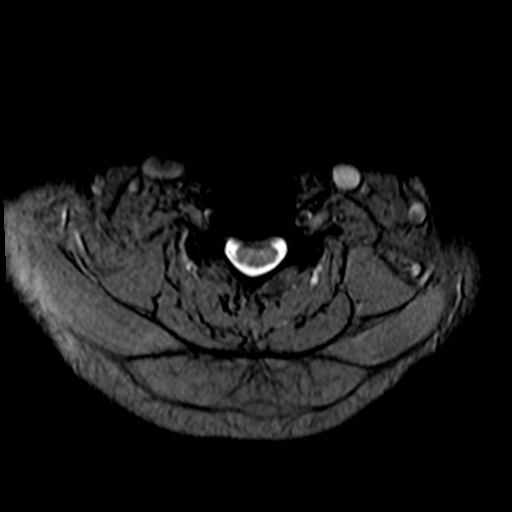
[im 15/34]
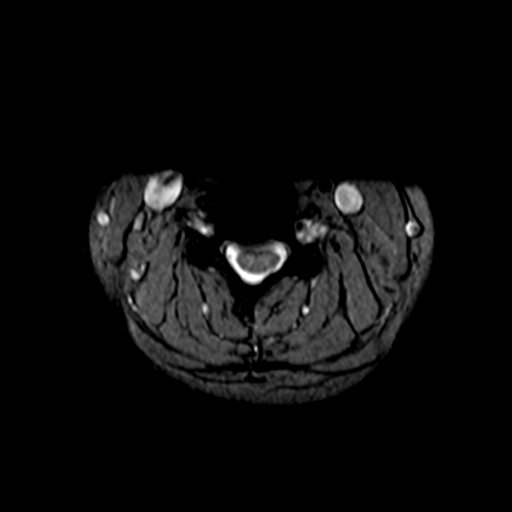
[im 19/34]
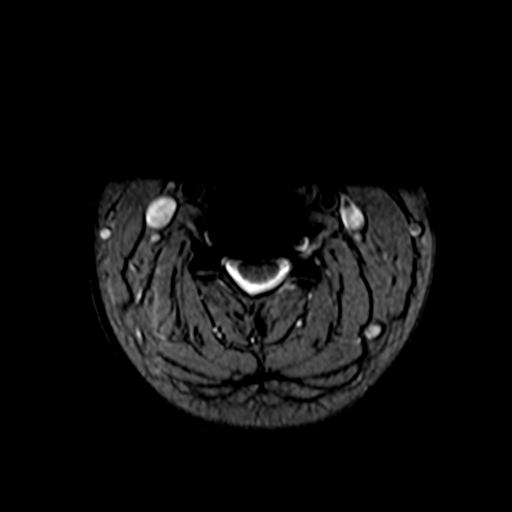
[im 24/34]
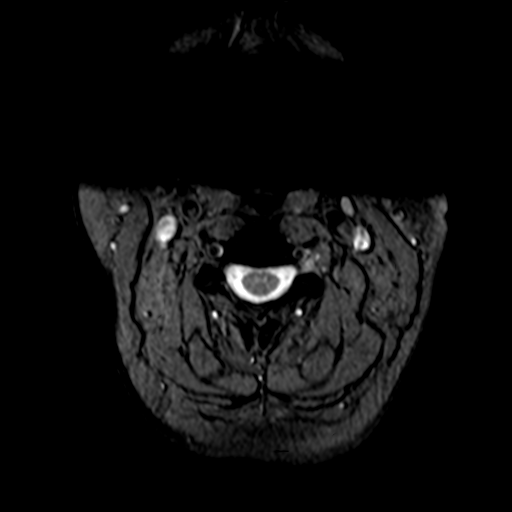
[im 29/34]
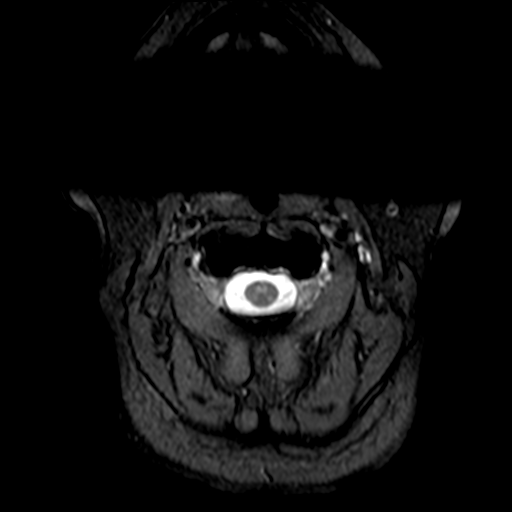
[im 34/34]
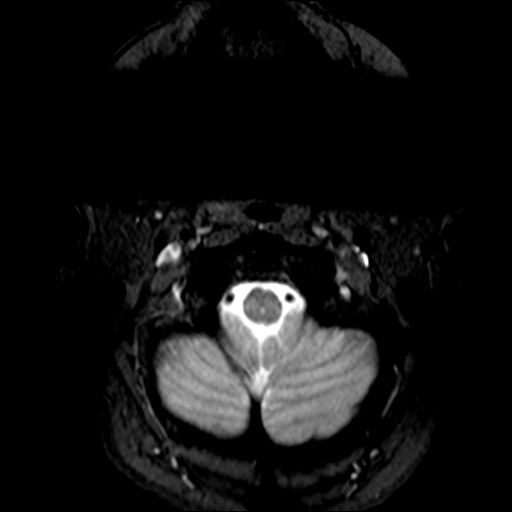

[41 of 48 positions shown; findings below may reference images not displayed]

FINDINGS: Alignment: Straightening with slight reversal of the normal upper
cervical lordosis. Trace anterolisthesis of C2 on C3.

Vertebrae: Prior ACDF at C4-C6. Vertebral body height maintained
without acute or chronic fracture. Bone marrow signal intensity
within normal limits. No worrisome osseous lesions or abnormal
marrow edema.

Cord: Normal signal and morphology.

Posterior Fossa, vertebral arteries, paraspinal tissues:
Unremarkable.

Disc levels:

C2-C3: Trace anterolisthesis.  Otherwise unremarkable.  No stenosis.

C3-C4: Mild disc bulge with right greater than left uncovertebral
spurring. Superimposed tiny right paracentral disc protrusion
indents the right ventral thecal sac (series 5, image 16). No
significant spinal stenosis or cord deformity. Mild right C4
foraminal stenosis. Left neural foramina remains patent.

C4-C5:  Prior fusion.  No residual canal or foraminal stenosis.

C5-C6:  Prior fusion.  No residual canal or foraminal stenosis.

C6-C7: Minimal disc bulge with uncovertebral spurring. No
significant stenosis.

C7-T1:  Unremarkable.

Visualized upper thoracic spine demonstrates no significant finding.
IMPRESSION: 1. Prior ACDF at C4-C6 without residual or recurrent stenosis.
2. Adjacent segment disease at C3-4 with mild disc bulge and right
greater than left uncovertebral spurring, with small right
paracentral disc protrusion. Resultant mild right C4 foraminal
stenosis.
